# Patient Record
Sex: Female | Born: 1935 | Race: White | Hispanic: No | Marital: Married | Smoking: Never smoker
Health system: Southern US, Community
[De-identification: ages and names within clinical notes are randomized; demographics above are authoritative.]

## PROBLEM LIST (undated history)

## (undated) DIAGNOSIS — I219 Acute myocardial infarction, unspecified: Secondary | ICD-10-CM

## (undated) DIAGNOSIS — E785 Hyperlipidemia, unspecified: Secondary | ICD-10-CM

## (undated) DIAGNOSIS — M1990 Unspecified osteoarthritis, unspecified site: Secondary | ICD-10-CM

## (undated) DIAGNOSIS — N200 Calculus of kidney: Secondary | ICD-10-CM

## (undated) DIAGNOSIS — M7120 Synovial cyst of popliteal space [Baker], unspecified knee: Secondary | ICD-10-CM

## (undated) DIAGNOSIS — K589 Irritable bowel syndrome without diarrhea: Secondary | ICD-10-CM

## (undated) DIAGNOSIS — Z7189 Other specified counseling: Secondary | ICD-10-CM

## (undated) DIAGNOSIS — N390 Urinary tract infection, site not specified: Secondary | ICD-10-CM

## (undated) DIAGNOSIS — T7840XA Allergy, unspecified, initial encounter: Secondary | ICD-10-CM

## (undated) DIAGNOSIS — Z0000 Encounter for general adult medical examination without abnormal findings: Secondary | ICD-10-CM

## (undated) DIAGNOSIS — Z955 Presence of coronary angioplasty implant and graft: Secondary | ICD-10-CM

## (undated) DIAGNOSIS — I2510 Atherosclerotic heart disease of native coronary artery without angina pectoris: Secondary | ICD-10-CM

## (undated) DIAGNOSIS — R319 Hematuria, unspecified: Secondary | ICD-10-CM

## (undated) DIAGNOSIS — M810 Age-related osteoporosis without current pathological fracture: Secondary | ICD-10-CM

## (undated) DIAGNOSIS — F0390 Unspecified dementia without behavioral disturbance: Secondary | ICD-10-CM

## (undated) DIAGNOSIS — Z9289 Personal history of other medical treatment: Secondary | ICD-10-CM

## (undated) DIAGNOSIS — F419 Anxiety disorder, unspecified: Secondary | ICD-10-CM

## (undated) DIAGNOSIS — I10 Essential (primary) hypertension: Secondary | ICD-10-CM

## (undated) DIAGNOSIS — R079 Chest pain, unspecified: Secondary | ICD-10-CM

## (undated) DIAGNOSIS — K5790 Diverticulosis of intestine, part unspecified, without perforation or abscess without bleeding: Secondary | ICD-10-CM

## (undated) DIAGNOSIS — F03A0 Unspecified dementia, mild, without behavioral disturbance, psychotic disturbance, mood disturbance, and anxiety: Secondary | ICD-10-CM

## (undated) DIAGNOSIS — M797 Fibromyalgia: Secondary | ICD-10-CM

## (undated) DIAGNOSIS — J45909 Unspecified asthma, uncomplicated: Secondary | ICD-10-CM

## (undated) HISTORY — DX: Encounter for general adult medical examination without abnormal findings: Z00.00

## (undated) HISTORY — DX: Chest pain, unspecified: R07.9

## (undated) HISTORY — PX: CORONARY ANGIOPLASTY WITH STENT PLACEMENT: SHX49

## (undated) HISTORY — DX: Other specified counseling: Z71.89

## (undated) HISTORY — DX: Unspecified dementia, mild, without behavioral disturbance, psychotic disturbance, mood disturbance, and anxiety: F03.A0

## (undated) HISTORY — PX: TOTAL ABDOMINAL HYSTERECTOMY W/ BILATERAL SALPINGOOPHORECTOMY: SHX83

## (undated) HISTORY — DX: Essential (primary) hypertension: I10

## (undated) HISTORY — DX: Hyperlipidemia, unspecified: E78.5

## (undated) HISTORY — PX: CHOLECYSTECTOMY: SHX55

## (undated) HISTORY — DX: Fibromyalgia: M79.7

## (undated) HISTORY — DX: Synovial cyst of popliteal space (Baker), unspecified knee: M71.20

## (undated) HISTORY — DX: Calculus of kidney: N20.0

## (undated) HISTORY — PX: TONSILLECTOMY: SUR1361

## (undated) HISTORY — DX: Irritable bowel syndrome, unspecified: K58.9

## (undated) HISTORY — PX: BREAST BIOPSY: SHX20

## (undated) HISTORY — DX: Personal history of other medical treatment: Z92.89

## (undated) HISTORY — DX: Presence of coronary angioplasty implant and graft: Z95.5

## (undated) HISTORY — PX: LITHOTRIPSY: SUR834

## (undated) HISTORY — DX: Atherosclerotic heart disease of native coronary artery without angina pectoris: I25.10

## (undated) HISTORY — DX: Age-related osteoporosis without current pathological fracture: M81.0

## (undated) HISTORY — DX: Unspecified dementia, unspecified severity, without behavioral disturbance, psychotic disturbance, mood disturbance, and anxiety: F03.90

## (undated) HISTORY — DX: Acute myocardial infarction, unspecified: I21.9

## (undated) HISTORY — DX: Diverticulosis of intestine, part unspecified, without perforation or abscess without bleeding: K57.90

## (undated) HISTORY — PX: CYSTOSCOPY: SUR368

## (undated) HISTORY — DX: Anxiety disorder, unspecified: F41.9

## (undated) HISTORY — DX: Hematuria, unspecified: R31.9

## (undated) HISTORY — DX: Unspecified dementia without behavioral disturbance: F03.90

## (undated) HISTORY — PX: APPENDECTOMY: SHX54

## (undated) HISTORY — DX: Unspecified asthma, uncomplicated: J45.909

## (undated) HISTORY — DX: Urinary tract infection, site not specified: N39.0

## (undated) HISTORY — PX: URETEROSCOPY: SHX842

## (undated) HISTORY — DX: Allergy, unspecified, initial encounter: T78.40XA

## (undated) HISTORY — DX: Unspecified osteoarthritis, unspecified site: M19.90

## (undated) HISTORY — PX: ANGIOPLASTY: SHX39

## (undated) HISTORY — PX: BREAST SURGERY: SHX581

---

## 2008-08-12 HISTORY — PX: COLONOSCOPY: SHX174

## 2012-05-09 HISTORY — PX: CARDIAC CATHETERIZATION: SHX172

## 2012-05-15 DIAGNOSIS — I2510 Atherosclerotic heart disease of native coronary artery without angina pectoris: Secondary | ICD-10-CM | POA: Insufficient documentation

## 2012-05-16 DIAGNOSIS — I10 Essential (primary) hypertension: Secondary | ICD-10-CM | POA: Insufficient documentation

## 2012-07-15 DIAGNOSIS — M8580 Other specified disorders of bone density and structure, unspecified site: Secondary | ICD-10-CM | POA: Insufficient documentation

## 2012-09-23 DIAGNOSIS — N200 Calculus of kidney: Secondary | ICD-10-CM | POA: Insufficient documentation

## 2012-09-23 DIAGNOSIS — H47019 Ischemic optic neuropathy, unspecified eye: Secondary | ICD-10-CM | POA: Insufficient documentation

## 2013-07-28 DIAGNOSIS — I2510 Atherosclerotic heart disease of native coronary artery without angina pectoris: Secondary | ICD-10-CM

## 2013-07-28 DIAGNOSIS — R079 Chest pain, unspecified: Secondary | ICD-10-CM

## 2013-07-28 HISTORY — DX: Atherosclerotic heart disease of native coronary artery without angina pectoris: I25.10

## 2013-07-28 HISTORY — DX: Chest pain, unspecified: R07.9

## 2013-08-06 DIAGNOSIS — R109 Unspecified abdominal pain: Secondary | ICD-10-CM | POA: Insufficient documentation

## 2013-09-29 DIAGNOSIS — Z0000 Encounter for general adult medical examination without abnormal findings: Secondary | ICD-10-CM

## 2013-09-29 HISTORY — DX: Encounter for general adult medical examination without abnormal findings: Z00.00

## 2013-12-04 DIAGNOSIS — R609 Edema, unspecified: Secondary | ICD-10-CM | POA: Insufficient documentation

## 2013-12-04 DIAGNOSIS — F4320 Adjustment disorder, unspecified: Secondary | ICD-10-CM | POA: Insufficient documentation

## 2014-02-04 DIAGNOSIS — R413 Other amnesia: Secondary | ICD-10-CM | POA: Insufficient documentation

## 2014-06-05 DIAGNOSIS — F419 Anxiety disorder, unspecified: Secondary | ICD-10-CM | POA: Insufficient documentation

## 2014-10-07 DIAGNOSIS — F3341 Major depressive disorder, recurrent, in partial remission: Secondary | ICD-10-CM | POA: Insufficient documentation

## 2014-10-07 DIAGNOSIS — F3340 Major depressive disorder, recurrent, in remission, unspecified: Secondary | ICD-10-CM | POA: Insufficient documentation

## 2015-01-21 DIAGNOSIS — G301 Alzheimer's disease with late onset: Secondary | ICD-10-CM

## 2015-01-21 DIAGNOSIS — F0280 Dementia in other diseases classified elsewhere without behavioral disturbance: Secondary | ICD-10-CM | POA: Insufficient documentation

## 2015-02-04 ENCOUNTER — Emergency Department: Payer: Medicare Other

## 2015-02-04 ENCOUNTER — Emergency Department
Admission: EM | Admit: 2015-02-04 | Discharge: 2015-02-04 | Disposition: A | Discharge status: Home / Self Care | Payer: Medicare Other | Attending: Emergency Medicine | Admitting: Emergency Medicine

## 2015-02-04 ENCOUNTER — Encounter: Payer: Self-pay | Admitting: Emergency Medicine

## 2015-02-04 DIAGNOSIS — R531 Weakness: Secondary | ICD-10-CM | POA: Diagnosis not present

## 2015-02-04 DIAGNOSIS — R4182 Altered mental status, unspecified: Secondary | ICD-10-CM | POA: Diagnosis present

## 2015-02-04 DIAGNOSIS — I10 Essential (primary) hypertension: Secondary | ICD-10-CM | POA: Insufficient documentation

## 2015-02-04 DIAGNOSIS — Z79899 Other long term (current) drug therapy: Secondary | ICD-10-CM | POA: Diagnosis not present

## 2015-02-04 DIAGNOSIS — R410 Disorientation, unspecified: Secondary | ICD-10-CM

## 2015-02-04 DIAGNOSIS — F0390 Unspecified dementia without behavioral disturbance: Secondary | ICD-10-CM | POA: Insufficient documentation

## 2015-02-04 HISTORY — DX: Atherosclerotic heart disease of native coronary artery without angina pectoris: I25.10

## 2015-02-04 HISTORY — DX: Calculus of kidney: N20.0

## 2015-02-04 LAB — CBC WITH DIFFERENTIAL/PLATELET
Basophils Absolute: 0 10*3/uL (ref 0–0.1)
Basophils Relative: 1 %
Eosinophils Absolute: 0.2 10*3/uL (ref 0–0.7)
Eosinophils Relative: 4 %
HEMATOCRIT: 38.9 % (ref 35.0–47.0)
HEMOGLOBIN: 12.8 g/dL (ref 12.0–16.0)
Lymphocytes Relative: 26 %
Lymphs Abs: 1.4 10*3/uL (ref 1.0–3.6)
MCH: 29.1 pg (ref 26.0–34.0)
MCHC: 32.8 g/dL (ref 32.0–36.0)
MCV: 88.7 fL (ref 80.0–100.0)
MONOS PCT: 10 %
Monocytes Absolute: 0.5 10*3/uL (ref 0.2–0.9)
Neutro Abs: 3.2 10*3/uL (ref 1.4–6.5)
Neutrophils Relative %: 59 %
Platelets: 155 10*3/uL (ref 150–440)
RBC: 4.39 MIL/uL (ref 3.80–5.20)
RDW: 14.2 % (ref 11.5–14.5)
WBC: 5.4 10*3/uL (ref 3.6–11.0)

## 2015-02-04 LAB — COMPREHENSIVE METABOLIC PANEL
ALBUMIN: 3.6 g/dL (ref 3.5–5.0)
ALK PHOS: 88 U/L (ref 38–126)
ALT: 23 U/L (ref 14–54)
ANION GAP: 8 (ref 5–15)
AST: 31 U/L (ref 15–41)
BILIRUBIN TOTAL: 0.5 mg/dL (ref 0.3–1.2)
BUN: 18 mg/dL (ref 6–20)
CO2: 29 mmol/L (ref 22–32)
Calcium: 8.7 mg/dL — ABNORMAL LOW (ref 8.9–10.3)
Chloride: 104 mmol/L (ref 101–111)
Creatinine, Ser: 0.79 mg/dL (ref 0.44–1.00)
GFR calc Af Amer: >60 mL/min (ref >60)
GFR calc non Af Amer: >60 mL/min (ref >60)
Glucose, Bld: 98 mg/dL (ref 65–99)
Potassium: 4.6 mmol/L (ref 3.5–5.1)
SODIUM: 141 mmol/L (ref 135–145)
TOTAL PROTEIN: 6.6 g/dL (ref 6.5–8.1)

## 2015-02-04 LAB — URINALYSIS COMPLETE WITH MICROSCOPIC (ARMC ONLY)
Bacteria, UA: NONE SEEN
Bilirubin Urine: NEGATIVE
Glucose, UA: NEGATIVE mg/dL
Hgb urine dipstick: NEGATIVE
Ketones, ur: NEGATIVE mg/dL
NITRITE: NEGATIVE
PH: 5 (ref 5.0–8.0)
Protein, ur: NEGATIVE mg/dL
Specific Gravity, Urine: 1.011 (ref 1.005–1.030)

## 2015-02-04 LAB — TROPONIN I: Troponin I: <0.03 ng/mL (ref <0.031)

## 2015-02-04 NOTE — ED Provider Notes (Signed)
CSN: [id number]     Arrival date & time 02/04/15  [id number] History   First MD Initiated Contact with Patient 02/04/15 [phone]     Chief Complaint  Patient presents with  . Weakness  . Altered Mental Status     (Consider location/radiation/quality/duration/timing/severity/associated sxs/prior Treatment) The history is provided by the patient and the spouse. The history is limited by the condition of the patient.  Sara Stout is a 81 y.o. female through dementia, hypertension, hyperlipidemia here presenting with altered mental status. She woke up about 5:30 AM and was more tired than usual. She was making coffee this morning and then went back to sleep. Also was confused as per the husband. She was a little disoriented and didn't know where she was. She doesn't remember what happened and feels fine now. Denies any history of stroke and was noted to have low-grade temp 99 by EMS but denies any fevers at home. Denies any cough or abdominal pain or urinary symptoms.   Level V caveat- dementia   Past Medical History  Diagnosis Date  . Coronary artery disease   . Kidney stones    Past Surgical History  Procedure Laterality Date  . Angioplasty    . Cholecystectomy    . Abdominal hysterectomy     No family history on file. History  Substance Use Topics  . Smoking status: Never Smoker   . Smokeless tobacco: Not on file  . Alcohol Use: No   OB History    No data available     Review of Systems  Neurological: Positive for weakness.  All other systems reviewed and are negative.     Allergies  Review of patient's allergies indicates not on file.  Home Medications   Prior to Admission medications   Medication Sig Start Date End Date Taking? Authorizing Provider  atorvastatin (LIPITOR) 80 MG tablet Take 80 mg by mouth at bedtime.   Yes Historical Provider, MD  donepezil (ARICEPT) 10 MG tablet Take 10 mg by mouth at bedtime.   Yes Historical Provider, MD  escitalopram (LEXAPRO) 10 MG  tablet Take 10 mg by mouth at bedtime.   Yes Historical Provider, MD  lisinopril (PRINIVIL,ZESTRIL) 2.5 MG tablet Take 2.5 mg by mouth at bedtime.   Yes Historical Provider, MD  memantine (NAMENDA XR) 28 MG CP24 24 hr capsule Take 28 mg by mouth at bedtime.   Yes Historical Provider, MD   BP 170/66 mmHg  Pulse 54  Temp(Src) 98.8 F (37.1 C) (Oral)  Resp 14  Ht 5\' 4"  (1.626 m)  Wt 158 lb (71.668 kg)  BMI 27.11 kg/m2  SpO2 98% Physical Exam  Constitutional:  Chronically ill, NAD   HENT:  Head: Normocephalic.  Mouth/Throat: Oropharynx is clear and moist.  Eyes: Conjunctivae are normal. Pupils are equal, round, and reactive to light.  Neck: Normal range of motion. Neck supple.  Cardiovascular: Normal rate, regular rhythm and normal heart sounds.   Pulmonary/Chest: Effort normal and breath sounds normal. No respiratory distress. She has no wheezes. She has no rales.  Abdominal: Soft. Bowel sounds are normal. She exhibits no distension. There is no tenderness. There is no rebound and no guarding.  Musculoskeletal: Normal range of motion. She exhibits no edema or tenderness.  Neurological:  Alert, A &O x 2 (baseline), CN 2-12 intact. Nl strength throughout. Nl finger to nose. Nl sensation.   Skin: Skin is warm and dry.  Psychiatric: She has a normal mood and affect. Her behavior is normal. Judgment  and thought content normal.  Nursing note and vitals reviewed.   ED Course  Procedures (including critical care time) Labs Review Labs Reviewed  COMPREHENSIVE METABOLIC PANEL - Abnormal; Notable for the following:    Calcium 8.7 (*)    All other components within normal limits  URINALYSIS COMPLETEWITH MICROSCOPIC ([facility] ONLY) - Abnormal; Notable for the following:    Color, Urine STRAW (*)    APPearance CLEAR (*)    Leukocytes, UA 1+ (*)    Squamous Epithelial / LPF 0-5 (*)    All other components within normal limits  CBC WITH DIFFERENTIAL/PLATELET  TROPONIN I    Imaging  Review Dg Chest 2 View  02/04/2015   CLINICAL DATA:  Confusion/altered mental status  EXAM: CHEST  2 VIEW  COMPARISON:  None.  FINDINGS: There is no edema or consolidation. Heart size and pulmonary vascularity are normal. No adenopathy. There is atherosclerotic change in the aorta. No bone lesions.  IMPRESSION: No edema or consolidation.   Electronically Signed   By: Bretta Bang III M.D.   On: 02/04/2015 09:08   Ct Head Wo Contrast  02/04/2015   CLINICAL DATA:  Weakness. Altered mental status. The patient awoke and was normal today. After lying down again for a short period, she was confused.  EXAM: CT HEAD WITHOUT CONTRAST  TECHNIQUE: Contiguous axial images were obtained from the base of the skull through the vertex without intravenous contrast.  COMPARISON:  None.  FINDINGS: Mild generalized atrophy and white matter hypoattenuation is likely within normal limits for age. No acute infarct, hemorrhage, or mass lesion is present. The ventricles are of normal size. No significant extraaxial fluid collection is present.  Vascular calcifications are present at the dural margin of the vertebral arteries and within the cavernous internal carotid arteries bilaterally. The paranasal sinuses and mastoid air cells are clear. Calvarium is intact. The visualized globes and orbits are within normal limits.  IMPRESSION: Negative CT of the head.   Electronically Signed   By: Marin Roberts M.D.   On: 02/04/2015 09:12     EKG Interpretation None     ED ECG REPORT   Date: 02/04/2015  EKG Time: 10:57 AM  Rate: 55  Rhythm: sinus bradycardia,  sinus bradycardia  Axis: normal  Intervals:none  ST&T Change: TWI III, aVF  Narrative Interpretation:              MDM   Final diagnoses:  Confusion    Sara Stout is a 81 y.o. female here with confusion. Consider dementia vs electrolyte abnormalities vs infection. Will get CT head, labs, UA, CXR.   10:57 AM Labs and UA and CT head and CXR  unremarkable. Able to ambulate. BP dec to 130/50 with no intervention. Back to baseline. Likely dementia. Will dc home.    Richardean Canal, MD 02/04/15 1058

## 2015-02-04 NOTE — ED Notes (Signed)
Pts husband states she got up this morning, made a cup of coffee, then came in the room, laid down, then woke up a few minutes stating she was confused and didn't recognize her surroundings, speech clear per husband, but states she was very weak, unable to lift her arms. Upon arrival to ED, pt appears in no distress, however, falls asleep/weak in between questions. Pt able to answer all questions appropriately, NIH negative. Low grade temp of 99, BG 96 per ems. Pt denies any complaints.

## 2015-02-04 NOTE — ED Notes (Signed)
Pt sitting up in bed, alert and oriented, denies any complaints.

## 2015-02-04 NOTE — ED Notes (Signed)
Patient transported to X-ray 

## 2015-02-04 NOTE — ED Notes (Signed)
PT AMBULATED TO BATHROOM AND BACK TO BED. PT HAD A STEADY GAIT.

## 2015-02-04 NOTE — ED Notes (Signed)
Patient transported to CT 

## 2015-02-04 NOTE — ED Notes (Signed)
Pt husband here to visit. Advised to call [phone] for transportation when he is ready.

## 2015-02-04 NOTE — ED Notes (Signed)
Taken to lobby via wheelchair, husband with pt.

## 2015-02-04 NOTE — Discharge Instructions (Signed)
Continue taking your current meds.   See your doctor.   Return to ER if you are more confused, lethargic, not behaving normally.

## 2015-02-04 NOTE — ED Notes (Signed)
Family at bedside. 

## 2015-05-12 ENCOUNTER — Encounter: Payer: Self-pay | Admitting: *Deleted

## 2015-05-12 DIAGNOSIS — M797 Fibromyalgia: Secondary | ICD-10-CM | POA: Insufficient documentation

## 2015-05-12 DIAGNOSIS — Z955 Presence of coronary angioplasty implant and graft: Secondary | ICD-10-CM | POA: Insufficient documentation

## 2015-05-12 DIAGNOSIS — E785 Hyperlipidemia, unspecified: Secondary | ICD-10-CM | POA: Insufficient documentation

## 2015-05-12 DIAGNOSIS — Z9889 Other specified postprocedural states: Secondary | ICD-10-CM | POA: Insufficient documentation

## 2015-05-12 DIAGNOSIS — K589 Irritable bowel syndrome without diarrhea: Secondary | ICD-10-CM | POA: Insufficient documentation

## 2015-06-02 ENCOUNTER — Encounter: Payer: Self-pay | Admitting: Urology

## 2015-06-02 ENCOUNTER — Ambulatory Visit (INDEPENDENT_AMBULATORY_CARE_PROVIDER_SITE_OTHER): Payer: Medicare Other | Admitting: Urology

## 2015-06-02 VITALS — BP 116/74 | HR 85 | Ht 63.0 in | Wt 154.2 lb

## 2015-06-02 DIAGNOSIS — I2510 Atherosclerotic heart disease of native coronary artery without angina pectoris: Secondary | ICD-10-CM | POA: Insufficient documentation

## 2015-06-02 DIAGNOSIS — N200 Calculus of kidney: Secondary | ICD-10-CM | POA: Diagnosis not present

## 2015-06-02 DIAGNOSIS — R300 Dysuria: Secondary | ICD-10-CM

## 2015-06-02 DIAGNOSIS — M797 Fibromyalgia: Secondary | ICD-10-CM | POA: Insufficient documentation

## 2015-06-02 LAB — URINALYSIS, COMPLETE
Bilirubin, UA: NEGATIVE
Glucose, UA: NEGATIVE
Nitrite, UA: NEGATIVE
PROTEIN UA: NEGATIVE
RBC, UA: NEGATIVE
Specific Gravity, UA: 1.025 (ref 1.005–1.030)
Urobilinogen, Ur: 0.2 mg/dL (ref 0.2–1.0)
pH, UA: 5 (ref 5.0–7.5)

## 2015-06-02 LAB — MICROSCOPIC EXAMINATION
Bacteria, UA: NONE SEEN
RBC MICROSCOPIC, UA: NONE SEEN /HPF (ref 0–?)

## 2015-06-02 LAB — BLADDER SCAN AMB NON-IMAGING: Scan Result: 12

## 2015-06-02 MED ORDER — PHENAZOPYRIDINE HCL 95 MG PO TABS
95.0000 mg | ORAL_TABLET | Freq: Three times a day (TID) | ORAL | Status: DC
Start: 2015-06-02 — End: 2015-07-23

## 2015-06-02 NOTE — Progress Notes (Signed)
06/02/2015 2:35 PM   Sara Stout April 15, 1931 [id number]  Referring Elke Holtry: [name], MD [address] [place], [place] [address]  Chief Complaint  Patient presents with  . Dysuria    with history of nephrolithasis     HPI: The patient is a 81 year old female with a history of renal stones. She last had a renal stone 5 years ago. At this time she is asymptomatic from renal stones. She presents today after moving to the area and wanted to establish care with the urologist again she is not symptom at this time from stones. She also notes a intermittent vague dysuria for the last few months. She had a urine culture at the emergency clinic that was negative. She was on Cipro for 3 days until the culture came back negative. She is very just comfort now. Her urinalysis shows 0-5 white blood cells 0 10 epithelial cells. She denies any cloudy urine. Denies frequency, urgency, intermittency, hesitancy, weak stream and vaginal bleeding.  PMH: Past Medical History  Diagnosis Date  . Coronary artery disease   . Kidney stones   . Diverticulosis   . UTI (lower urinary tract infection)   . Hematuria   . IBS (irritable bowel syndrome)   . Fibromyalgia     Surgical History: Past Surgical History  Procedure Laterality Date  . Angioplasty    . Cholecystectomy    . Abdominal hysterectomy    . Tonsillectomy    . Appendectomy    . Total abdominal hysterectomy w/ bilateral salpingoophorectomy    . Breast biopsy      benign nodule  . Cystoscopy      Home Medications:    Medication List       This list is accurate as of: 06/02/15  2:35 PM.  Always use your most recent med list.               aspirin 81 MG tablet  Take 81 mg by mouth.     atorvastatin 80 MG tablet  Commonly known as:  LIPITOR  Take 80 mg by mouth at bedtime.     ciprofloxacin 500 MG tablet  Commonly known as:  CIPRO     donepezil 10 MG tablet  Commonly known as:  ARICEPT  Take 10 mg by mouth  at bedtime.     escitalopram 10 MG tablet  Commonly known as:  LEXAPRO  Take 10 mg by mouth at bedtime.     hydrocortisone cream 1 %  Apply topically.     lisinopril 2.5 MG tablet  Commonly known as:  PRINIVIL,ZESTRIL  Take 2.5 mg by mouth at bedtime.     loratadine 10 MG tablet  Commonly known as:  CLARITIN  Take 10 mg by mouth.     meloxicam 15 MG tablet  Commonly known as:  MOBIC  Take by mouth.     memantine 28 MG Cp24 24 hr capsule  Commonly known as:  NAMENDA XR  Take 28 mg by mouth at bedtime.     NITROSTAT 0.4 MG SL tablet  Generic drug:  nitroGLYCERIN  Place 1 tablet under the tongue.        Allergies:  Allergies  Allergen Reactions  . Epinephrine Other (See Comments)    Rapid heart beat.  . Dtap-Hepatitis B Recomb-Ipv Other (See Comments)  . Levofloxacin     Edema in knee  . Metronidazole     Nausea and edema in knee  . Tetanus Toxoids  Arm swelling    Family History: Family History  Problem Relation Age of Onset  . Prostate cancer Neg Hx   . Bladder Cancer Neg Hx   . Kidney cancer Neg Hx     Social History:  reports that she has never smoked. She does not have any smokeless tobacco history on file. She reports that she does not drink alcohol or use illicit drugs.  ROS: UROLOGY Frequent Urination?: No Hard to postpone urination?: No Burning/pain with urination?: No Get up at night to urinate?: No Leakage of urine?: No Urine stream starts and stops?: No Trouble starting stream?: No Do you have to strain to urinate?: No Blood in urine?: No Urinary tract infection?: No Sexually transmitted disease?: No Injury to kidneys or bladder?: No Painful intercourse?: No Weak stream?: No Currently pregnant?: No Vaginal bleeding?: No Last menstrual period?: n  Gastrointestinal Nausea?: No Vomiting?: No Indigestion/heartburn?: No Diarrhea?: No Constipation?: No  Constitutional Fever: No Night sweats?: No Weight loss?: No Fatigue?:  No  Skin Skin rash/lesions?: No Itching?: No  Eyes Blurred vision?: No Double vision?: No  Ears/Nose/Throat Sore throat?: No Sinus problems?: Yes  Hematologic/Lymphatic Swollen glands?: No Easy bruising?: No  Cardiovascular Leg swelling?: Yes Chest pain?: No  Respiratory Cough?: No Shortness of breath?: No  Endocrine Excessive thirst?: No  Musculoskeletal Back pain?: No Joint pain?: Yes  Neurological Headaches?: No Dizziness?: Yes  Psychologic Depression?: No Anxiety?: Yes  Physical Exam: Ht  (1.6 m)  Wt 154 lb 3.2 oz (69.945 kg)  BMI 27.32 kg/m2  Constitutional:  Alert and oriented, No acute distress. HEENT: [place] AT, moist mucus membranes.  Trachea midline, no masses. Cardiovascular: No clubbing, cyanosis, or edema. Respiratory: Normal respiratory effort, no increased work of breathing. GI: Abdomen is soft, nontender, nondistended, no abdominal masses GU: No CVA tenderness.  Skin: No rashes, bruises or suspicious lesions. Lymph: No cervical or inguinal adenopathy. Neurologic: Grossly intact, no focal deficits, moving all 4 extremities. Psychiatric: Normal mood and affect.  Laboratory Data: Lab Results  Component Value Date   WBC 5.4 02/04/2015   HGB 12.8 02/04/2015   HCT 38.9 02/04/2015   MCV 88.7 02/04/2015   PLT 155 02/04/2015    Lab Results  Component Value Date   CREATININE 0.79 02/04/2015    No results found for: PSA  No results found for: TESTOSTERONE  No results found for: HGBA1C  Urinalysis    Component Value Date/Time   COLORURINE STRAW* 02/04/2015 0926   APPEARANCEUR CLEAR* 02/04/2015 0926   LABSPEC 1.011 02/04/2015 0926   PHURINE 5.0 02/04/2015 0926   GLUCOSEU NEGATIVE 02/04/2015 0926   HGBUR NEGATIVE 02/04/2015 0926   BILIRUBINUR NEGATIVE 02/04/2015 0926   KETONESUR NEGATIVE 02/04/2015 0926   PROTEINUR NEGATIVE 02/04/2015 0926   NITRITE NEGATIVE 02/04/2015 0926   LEUKOCYTESUR 1+* 02/04/2015 0926    *  Assessment & Plan:    1. History of Calculus of kidney -No evidence of stone. Follow-up for renal stones when necessary  2. Dysuria -Urine culture -We'll give the patient 2 days of Pyridium for dysuria. We wil only treat her dysuria with antibiotics if her culture comes back positive -The patient will follow-up in one month for systems pertinent persist   No Follow-up on file.  Hildred Laser, MD  [facility] [address], [address] [place], [place] [address] [phone]

## 2015-06-04 LAB — CULTURE, URINE COMPREHENSIVE

## 2015-07-07 ENCOUNTER — Ambulatory Visit: Payer: Medicare Other

## 2015-07-08 ENCOUNTER — Ambulatory Visit: Payer: Medicare Other | Admitting: Urology

## 2015-07-08 ENCOUNTER — Encounter: Payer: Self-pay | Admitting: Urology

## 2015-07-23 ENCOUNTER — Encounter: Payer: Self-pay | Admitting: Urology

## 2015-07-23 ENCOUNTER — Ambulatory Visit (INDEPENDENT_AMBULATORY_CARE_PROVIDER_SITE_OTHER): Payer: Medicare Other | Admitting: Urology

## 2015-07-23 VITALS — BP 122/76 | HR 83 | Ht 63.0 in | Wt 156.3 lb

## 2015-07-23 DIAGNOSIS — N200 Calculus of kidney: Secondary | ICD-10-CM | POA: Diagnosis not present

## 2015-07-23 DIAGNOSIS — R300 Dysuria: Secondary | ICD-10-CM | POA: Diagnosis not present

## 2015-07-23 LAB — URINALYSIS, COMPLETE
BILIRUBIN UA: NEGATIVE
Glucose, UA: NEGATIVE
Ketones, UA: NEGATIVE
NITRITE UA: NEGATIVE
PH UA: 6 (ref 5.0–7.5)
PROTEIN UA: NEGATIVE
RBC, UA: NEGATIVE
Specific Gravity, UA: 1.015 (ref 1.005–1.030)
UUROB: 0.2 mg/dL (ref 0.2–1.0)

## 2015-07-23 LAB — MICROSCOPIC EXAMINATION: RBC MICROSCOPIC, UA: NONE SEEN /HPF (ref 0–?)

## 2015-07-23 NOTE — Progress Notes (Signed)
07/23/2015 3:50 PM   Sara Stout 1931/08/17 [id number]  Referring provider: [name], MD [address] [facility] [place] [facility] [place], [place] [address]  Chief Complaint  Patient presents with  . Nephrolithiasis    HPI: The patient is a 81 year old female with a history of renal stones. She last had a renal stone 5 years ago. At this time she is asymptomatic from renal stones. She presents today after moving to the area and wanted to establish care with the urologist again she is not symptom at this time from stones. She also notes a intermittent vague dysuria for the last few months. She had a urine culture at the emergency clinic that was negative. She was on Cipro for 3 days until the culture came back negative. She has some comfort now. Her urinalysis shows 0-5 white blood cells 0 10 epithelial cells. She denies any cloudy urine. Denies frequency, urgency, intermittency, hesitancy, weak stream and vaginal bleeding.  Interval History: The patient's urine culture from her last visit was negative.  Her dysuria that her last a few months has resolved at this time without antibiotic therapy. She has no flank pain or other symptoms of kidney stones at this time. She is otherwise doing well.   PMH: Past Medical History  Diagnosis Date  . Coronary artery disease   . Kidney stones   . Diverticulosis   . UTI (lower urinary tract infection)   . Hematuria   . IBS (irritable bowel syndrome)   . Fibromyalgia     Surgical History: Past Surgical History  Procedure Laterality Date  . Angioplasty    . Cholecystectomy    . Abdominal hysterectomy    . Tonsillectomy    . Appendectomy    . Total abdominal hysterectomy w/ bilateral salpingoophorectomy    . Breast biopsy      benign nodule  . Cystoscopy      Home Medications:    Medication List       This list is accurate as of: 07/23/15  3:50 PM.  Always use your most recent med list.               aspirin  81 MG tablet  Take 81 mg by mouth.     atorvastatin 80 MG tablet  Commonly known as:  LIPITOR  Take 80 mg by mouth at bedtime.     escitalopram 10 MG tablet  Commonly known as:  LEXAPRO  Take 10 mg by mouth at bedtime.     hydrocortisone cream 1 %  Apply topically.     meloxicam 15 MG tablet  Commonly known as:  MOBIC  Take by mouth.     memantine 28 MG Cp24 24 hr capsule  Commonly known as:  NAMENDA XR  Take 28 mg by mouth at bedtime.     NITROSTAT 0.4 MG SL tablet  Generic drug:  nitroGLYCERIN  Place 1 tablet under the tongue.        Allergies:  Allergies  Allergen Reactions  . Epinephrine Other (See Comments)    Rapid heart beat.  . Dtap-Hepatitis B Recomb-Ipv Other (See Comments)  . Levofloxacin     Edema in knee  . Metronidazole     Nausea and edema in knee  . Tetanus Toxoids     Arm swelling    Family History: Family History  Problem Relation Age of Onset  . Prostate cancer Neg Hx   . Bladder Cancer Neg Hx   .  Kidney cancer Neg Hx     Social History:  reports that she has never smoked. She does not have any smokeless tobacco history on file. She reports that she does not drink alcohol or use illicit drugs.  ROS: UROLOGY Frequent Urination?: No Hard to postpone urination?: No Burning/pain with urination?: No Get up at night to urinate?: No Leakage of urine?: Yes Urine stream starts and stops?: No Trouble starting stream?: No Do you have to strain to urinate?: No Blood in urine?: No Urinary tract infection?: Yes Sexually transmitted disease?: No Injury to kidneys or bladder?: No Painful intercourse?: No Weak stream?: No Currently pregnant?: No Vaginal bleeding?: No Last menstrual period?: n  Gastrointestinal Nausea?: No Vomiting?: No Indigestion/heartburn?: No Diarrhea?: No Constipation?: No  Constitutional Fever: No Night sweats?: No Weight loss?: No Fatigue?: No  Skin Skin rash/lesions?: No Itching?: No  Eyes Blurred  vision?: No Double vision?: No  Ears/Nose/Throat Sore throat?: No Sinus problems?: Yes  Hematologic/Lymphatic Swollen glands?: No Easy bruising?: No  Cardiovascular Leg swelling?: Yes Chest pain?: No  Respiratory Cough?: No Shortness of breath?: No  Endocrine Excessive thirst?: No  Musculoskeletal Back pain?: No Joint pain?: Yes  Neurological Headaches?: No Dizziness?: Yes  Psychologic Depression?: No Anxiety?: No  Physical Exam: BP 122/76 mmHg  Pulse 83  Ht 5\' 3"  (1.6 m)  Wt 156 lb 4.8 oz (70.897 kg)  BMI 27.69 kg/m2  Constitutional:  Alert and oriented, No acute distress. HEENT: [place] AT, moist mucus membranes.  Trachea midline, no masses. Cardiovascular: No clubbing, cyanosis, or edema. Respiratory: Normal respiratory effort, no increased work of breathing. GI: Abdomen is soft, nontender, nondistended, no abdominal masses GU: No CVA tenderness.  Skin: No rashes, bruises or suspicious lesions. Lymph: No cervical or inguinal adenopathy. Neurologic: Grossly intact, no focal deficits, moving all 4 extremities. Psychiatric: Normal mood and affect.  Laboratory Data: Lab Results  Component Value Date   WBC 5.4 02/04/2015   HGB 12.8 02/04/2015   HCT 38.9 02/04/2015   MCV 88.7 02/04/2015   PLT 155 02/04/2015    Lab Results  Component Value Date   CREATININE 0.79 02/04/2015    No results found for: PSA  No results found for: TESTOSTERONE  No results found for: HGBA1C  Urinalysis    Component Value Date/Time   COLORURINE STRAW* 02/04/2015 0926   APPEARANCEUR CLEAR* 02/04/2015 0926   LABSPEC 1.011 02/04/2015 0926   PHURINE 5.0 02/04/2015 0926   GLUCOSEU Negative 06/02/2015 1421   HGBUR NEGATIVE 02/04/2015 0926   BILIRUBINUR Negative 06/02/2015 1421   BILIRUBINUR NEGATIVE 02/04/2015 0926   KETONESUR NEGATIVE 02/04/2015 0926   PROTEINUR NEGATIVE 02/04/2015 0926   NITRITE Negative 06/02/2015 1421   NITRITE NEGATIVE 02/04/2015 0926   LEUKOCYTESUR  Trace* 06/02/2015 1421   LEUKOCYTESUR 1+* 02/04/2015 0926    Assessment & Plan:  1. Kidney stones No evidence of disease  2. Dysuria This has resolved without antibiotic therapy. No further workup needed. The patient was instructed to call the office if her symptoms of dysuria return or if she develops a urinary tract infection.   Return if symptoms worsen or fail to improve.  Hildred LaserBrian James Candler Ginsberg, MD  [facility] [address], [address] [place], [place] [address] [phone]

## 2015-08-25 ENCOUNTER — Ambulatory Visit: Payer: Medicare Other | Attending: Neurology

## 2015-08-25 VITALS — BP 109/57 | HR 90

## 2015-08-25 DIAGNOSIS — R2689 Other abnormalities of gait and mobility: Secondary | ICD-10-CM

## 2015-08-25 DIAGNOSIS — M25561 Pain in right knee: Secondary | ICD-10-CM | POA: Diagnosis present

## 2015-08-25 DIAGNOSIS — R531 Weakness: Secondary | ICD-10-CM | POA: Diagnosis present

## 2015-08-25 NOTE — Therapy (Signed)
[place] [facility] [address] [place], [place], [address] Phone: [phone]   Fax:  [phone]  Physical Therapy Evaluation  Patient Details  Name: Sara PlumHelen Stout MRN: [id number] Date of Birth: [birth year] Referring Provider: Cristopher PeruHemang Shah, MD  Encounter Date: 08/25/2015      PT End of Session - 08/25/15 1634    Visit Number 1   Number of Visits 9   Authorization Type 1   Authorization Time Period of 10   PT Start Time 1634   PT Stop Time 1737   PT Time Calculation (min) 63 min   Activity Tolerance Patient tolerated treatment well   Behavior During Therapy [facility] for tasks assessed/performed      Past Medical History  Diagnosis Date  . Coronary artery disease   . Diverticulosis   . UTI (lower urinary tract infection)   . Hematuria   . IBS (irritable bowel syndrome)   . Fibromyalgia   . Mild dementia   . Hypertension   . Osteoporosis   . Allergy   . Asthma   . Baker's cyst     R knee  . Fibromyalgia   . Coronary heart disease   . Anxiety   . Kidney stones     Past Surgical History  Procedure Laterality Date  . Angioplasty    . Cholecystectomy    . Abdominal hysterectomy    . Tonsillectomy    . Appendectomy    . Total abdominal hysterectomy w/ bilateral salpingoophorectomy    . Breast biopsy      benign nodule  . Cystoscopy    . Coronary angioplasty with stent placement      Filed Vitals:   08/25/15 1642 08/25/15 1702  BP: 108/55 109/57  Pulse: 77 90    Visit Diagnosis:  Imbalance - Plan: PT plan of care cert/re-cert  Weakness - Plan: PT plan of care cert/re-cert  Right knee pain - Plan: PT plan of care cert/re-cert      Subjective Assessment - 08/25/15 1642    Subjective R knee pain: 2/10 current. 5/10 at worst when pt moves too fast   Pertinent History Balance difficulties. Husband thinks her balance might be due to medications. Just got out of blood pressure medication. Pt usually  shaky in the mornings and pt cannot do too much because she might pass out. She has to sit for about 20 seconds before standing up. Has to sit down at times. Pt husband also states pt having memory problems and vertigo.  Pt husband also states pt having baker's cyst behind R knee.  Pt states that when she gets dizzy, the room does not really spin. No falls past 6 months. Pt husband however states pt loses balance and catches her. Balance problems started 2 years ago. Uses a SPC at times.    Patient Stated Goals Improve balance   Currently in Pain? Yes   Pain Score 2   2/10 current. 5/10 at worst when pt moves too fast   Pain Location --  R knee   Multiple Pain Sites No            [facility] - 08/25/15 1651    Assessment   Medical Diagnosis Imbalance   Referring Provider Cristopher PeruHemang Shah, MD   Onset Date/Surgical Date 07/30/15   Prior Therapy No known physical therapy for current condition   Precautions   Precaution Comments fall risk   Restrictions   Other Position/Activity Restrictions no  known restrictions   Balance Screen   Has the patient fallen in the past 6 months Yes  Medical screening form answer: fall past month   Has the patient had a decrease in activity level because of a fear of falling?  --  Not known but pt states fear of falling.    Is the patient reluctant to leave their home because of a fear of falling?  --  Not known but pt states fear of falling.    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, with less unsteadiness   Observation/Other Assessments   Observations (-) VBI   Lower Extremity Functional Scale  48/80   Posture/Postural Control   Posture Comments Bilaterally protracted shoulders and neck, bilateral foot pronation, R shoulder more lateral    AROM   Lumbar Flexion WFL   Lumbar Extension WFL   Lumbar - Right Side Bend WFL with slight low back discomfort   Lumbar - Left Side Bend WFL with slight  lumbar discomfort   Lumbar - Right Rotation WFL with low back dicomfort    Lumbar - Left Rotation WFL with R posterior knee discomfort   Strength   Overall Strength Comments Room spinning with transition of movement from supine to sit one time. When movement was repeated, symptoms no longer reproduced.    Right Hip Flexion 4/5   Right Hip Extension 4-/5   Right Hip External Rotation  4-/5   Right Hip ABduction 4/5   Left Hip Flexion 4/5   Left Hip Extension 4-/5   Left Hip External Rotation 4/5   Left Hip ABduction 4/5   Right Knee Flexion 4+/5   Right Knee Extension 5/5   Left Knee Flexion 4+/5   Left Knee Extension 5/5   Ambulation/Gait   Gait Comments Contralateral pelvic drop during stance phase bilaterally   Dynamic Gait Index   Level Surface Normal   Change in Gait Speed Normal   Gait with Horizontal Head Turns Normal   Gait with Vertical Head Turns Normal   Gait and Pivot Turn Normal   Step Over Obstacle Normal   Step Around Obstacles Mild Impairment   Steps Normal   Total Score 23   DGI comment: < or = 19 indicates risk for falls. Pt scored 23/24. Pt husband however, states that when patient stands after sitting for a while, pt loses balance.                            PT Education - 08/25/15 2033    Education provided Yes   Education Details plan of care   Person(s) Educated Patient   Methods Explanation   Comprehension Verbalized understanding             PT Long Term Goals - 08/25/15 2042    PT LONG TERM GOAL #1   Title Patient will improve her LEFS score by at least 9 points as a demonstration of improved function.    Baseline 48/80   Time 4   Period Weeks   Status New   PT LONG TERM GOAL #2   Title Patient will improve bilateral glute med and max strength by at least 1/2 MMT grade to promote ability to perform standing tasks.    Time 4   Period Weeks   Status New   PT LONG TERM GOAL #3   Title Patient will have a decrease in  R knee pain to 3/10 or less at worst to promote ability to perform standing tasks.    Baseline 5/10   Time 4   Period Weeks   Status New               Plan - 09-01-15 01[birth year]-32    Clinical Impression Statement Patient is an 81 year old female who came to phyisical therapy secondary to difficulty with balance. She also presents with bilateral hip weakness, altered posture, and R knee pain. Pt also scored very well with the Dynamic Gait Index suggesting decreased risk for falls when walking. Pt husband however mentions that patient loses balance after standing up from sitting a while. Patient will benefit from skilled physical therapy services to promote ability to stand and walk after prolonged sitting with less unsteadiness, as well as to improve bilateral LE strength, and to decrease R knee pain to promote ability to perform standing tasks.    Pt will benefit from skilled therapeutic intervention in order to improve on the following deficits Decreased strength;Pain;Decreased balance  Hx of fall per medical screening form   Rehab Potential Good   Clinical Impairments Affecting Rehab Potential age, mild dementia   PT Frequency 2x / week   PT Duration 4 weeks   PT Treatment/Interventions Therapeutic exercise;Manual techniques;Therapeutic activities;Iontophoresis /ml Dexamethasone;Patient/family education;Ultrasound;Balance training   PT Next Visit Plan Try Berg Balance Test. Hip strengthening.    Consulted and Agree with Plan of Care Patient;Family member/caregiver   Family Member Consulted husband          G-Codes - 2015-09-01 01-16-45    Functional Assessment Tool Used LEFS, patient interview, clinical presentation   Functional Limitation Mobility: Walking and moving around   Mobility: Walking and Moving Around Current Status [phone]) At least 40 percent but less than 60 percent impaired, limited or restricted   Mobility: Walking and Moving Around Goal Status [phone]) At least 20 percent  but less than 40 percent impaired, limited or restricted       Problem List Patient Active Problem List   Diagnosis Date Noted  . CAD in native artery 06/02/2015  . Fibromyalgia 06/02/2015  . Presence of stent in coronary artery 05/12/2015  . Adaptive colitis 05/12/2015  . HLD (hyperlipidemia) 05/12/2015  . History of biliary T-tube placement 05/12/2015  . Fibrositis 05/12/2015  . Alzheimer's dementia, late onset 01/21/2015  . Depression, major, recurrent, in partial remission (HCC) 10/07/2014  . Depression, major, in remission (HCC) 10/07/2014  . Anxiety disorder 06/05/2014  . Amnesia 02/04/2014  . Accumulation of fluid in tissues 12/04/2013  . Adaptation reaction 12/04/2013  . Left flank pain 08/06/2013  . Calculus of kidney 09/23/2012  . ION (ischemic optic neuropathy) 09/23/2012  . Osteopenia 07/15/2012  . BP (high blood pressure) 05/16/2012  . Atherosclerosis of coronary artery 05/15/2012   Thank you for your referral.   Loralyn Freshwater PT, DPT   Sep 01, 2015, 9:01 PM  [place] [facility] PHYSICAL AND SPORTS MEDICINE [address], [place], [address] Phone: [phone]   Fax:  [phone]  Name: Sara Stout MRN: [id number] Date of Birth: Jul 01, 1931

## 2015-08-30 ENCOUNTER — Ambulatory Visit: Payer: Medicare Other

## 2015-08-30 DIAGNOSIS — R2689 Other abnormalities of gait and mobility: Secondary | ICD-10-CM

## 2015-08-30 DIAGNOSIS — R531 Weakness: Secondary | ICD-10-CM

## 2015-08-30 DIAGNOSIS — M25561 Pain in right knee: Secondary | ICD-10-CM

## 2015-08-30 NOTE — Therapy (Signed)
[place] [facility] [address] [place], [place], [address] Phone: [phone]   Fax:  [phone]  Physical Therapy Treatment  Patient Details  Name: Sara PlumHelen Stout MRN: [id number] Date of Birth: 1931-08-01 Referring Provider: Cristopher PeruHemang Shah, MD  Encounter Date: 08/30/2015      PT End of Session - 08/30/15 1448    Visit Number 2   Number of Visits 9   Authorization Type 2   Authorization Time Period of 10   PT Start Time 1448  Pt arived late   PT Stop Time 1534   PT Time Calculation (min) 46 min   Activity Tolerance Patient tolerated treatment well   Behavior During Therapy [facility] for tasks assessed/performed      Past Medical History  Diagnosis Date  . Coronary artery disease   . Diverticulosis   . UTI (lower urinary tract infection)   . Hematuria   . IBS (irritable bowel syndrome)   . Fibromyalgia   . Mild dementia   . Hypertension   . Osteoporosis   . Allergy   . Asthma   . Baker's cyst     R knee  . Fibromyalgia   . Coronary heart disease   . Anxiety   . Kidney stones     Past Surgical History  Procedure Laterality Date  . Angioplasty    . Cholecystectomy    . Abdominal hysterectomy    . Tonsillectomy    . Appendectomy    . Total abdominal hysterectomy w/ bilateral salpingoophorectomy    . Breast biopsy      benign nodule  . Cystoscopy    . Coronary angioplasty with stent placement      There were no vitals filed for this visit.  Visit Diagnosis:  Imbalance  Weakness  Right knee pain      Subjective Assessment - 08/30/15 1451    Subjective Per husband, pt loses balance when someone bumps onto her and she is unaware.    Pertinent History Balance difficulties. Husband thinks her balance might be due to medications. Just got out of blood pressure medication. Pt usually shaky in the mornings and pt cannot do too much because she might pass out. She has to sit for about 20 seconds before  standing up. Has to sit down at times. Pt husband also states pt having memory problems and vertigo.  Pt husband also states pt having baker's cyst behind R knee.  Pt states that when she gets dizzy, the room does not really spin. No falls past 6 months. Pt husband however states pt loses balance and catches her. Balance problems started 2 years ago. Uses a SPC at times.    Patient Stated Goals Improve balance   Currently in Pain? Yes  General aches and pains, and R posterior knee pain   Pain Score --  No pain level provided.    Multiple Pain Sites No       Objectives  There-ex   Directed patient with static standing balance with manual perturbation  Biodex balance: limits of stability program    Bilateral UE assist 2x,     no UE assist 2x,     then with moving platform level 12 for 2x Seated knee extension resisting 3 lbs 10x2 with 5 seconds Standing heel raises 10x 5 seconds for 3 sets Standing ankle DF 10x3   Improved exercise technique, movement at target joints, use of target muscles after mod verbal, visual, tactile cues.  Pt demonstrates good balance when she plans for the movement. Difficulty with weight shifting with biodex balance activity at first but improved with practice.                          PT Education - 08/30/15 1551    Education provided Yes   Education Details ther-ex   [employer]) Educated Patient   Methods Explanation;Demonstration;Tactile cues;Verbal cues   Comprehension Verbalized understanding;Returned demonstration             PT Long Term Goals - 08/25/15 2042    PT LONG TERM GOAL #1   Title Patient will improve her LEFS score by at least 9 points as a demonstration of improved function.    Baseline 48/80   Time 4   Period Weeks   Status New   PT LONG TERM GOAL #2   Title Patient will improve bilateral glute med and max strength by at least 1/2 MMT grade to promote ability to perform standing tasks.    Time 4    Period Weeks   Status New   PT LONG TERM GOAL #3   Title Patient will have a decrease in R knee pain to 3/10 or less at worst to promote ability to perform standing tasks.    Baseline 5/10   Time 4   Period Weeks   Status New               Plan - 08/30/15 1552    Clinical Impression Statement Pt demonstrates good balance when she plans for the movement. Difficulty with weight shifting with biodex balance activity at first but improved with practice.   Pt will benefit from skilled therapeutic intervention in order to improve on the following deficits Decreased strength;Pain;Decreased balance  Hx of fall per medical screening form   Rehab Potential Good   Clinical Impairments Affecting Rehab Potential age, mild dementia   PT Frequency 2x / week   PT Duration 4 weeks   PT Treatment/Interventions Therapeutic exercise;Manual techniques;Therapeutic activities;Iontophoresis /ml Dexamethasone;Patient/family education;Ultrasound;Balance training   PT Next Visit Plan Try Berg Balance Test. Hip strengthening.    Consulted and Agree with Plan of Care Patient;Family member/caregiver   Family Member Consulted husband        Problem List Patient Active Problem List   Diagnosis Date Noted  . CAD in native artery 06/02/2015  . Fibromyalgia 06/02/2015  . Presence of stent in coronary artery 05/12/2015  . Adaptive colitis 05/12/2015  . HLD (hyperlipidemia) 05/12/2015  . History of biliary T-tube placement 05/12/2015  . Fibrositis 05/12/2015  . Alzheimer's dementia, late onset 01/21/2015  . Depression, major, recurrent, in partial remission (HCC) 10/07/2014  . Depression, major, in remission (HCC) 10/07/2014  . Anxiety disorder 06/05/2014  . Amnesia 02/04/2014  . Accumulation of fluid in tissues 12/04/2013  . Adaptation reaction 12/04/2013  . Left flank pain 08/06/2013  . Calculus of kidney 09/23/2012  . ION (ischemic optic neuropathy) 09/23/2012  . Osteopenia 07/15/2012  .  BP (high blood pressure) 05/16/2012  . Atherosclerosis of coronary artery 05/15/2012    Loralyn Freshwater PT, DPT   08/30/2015, 3:57 PM  [place] [facility] PHYSICAL AND SPORTS MEDICINE [address] [place], [address] Phone: [phone]   Fax:  [phone]  Name: Sara Stout MRN: [id number] Date of Birth: May 12, 1931

## 2015-09-02 ENCOUNTER — Ambulatory Visit: Payer: Medicare Other

## 2015-09-02 DIAGNOSIS — R531 Weakness: Secondary | ICD-10-CM

## 2015-09-02 DIAGNOSIS — R2689 Other abnormalities of gait and mobility: Secondary | ICD-10-CM

## 2015-09-02 DIAGNOSIS — M25561 Pain in right knee: Secondary | ICD-10-CM

## 2015-09-02 NOTE — Patient Instructions (Addendum)
   Sitting on a chair, cross right leg over left knee to feel  a stretch behind your right hip. Hold for 15 seconds, repeat 5 times. Do this 2 times a day.    Sitting on a chair or side of bed, bend ankles up and down 30 times before getting up from a chair, or out of your bed

## 2015-09-02 NOTE — Therapy (Signed)
[place] [facility] [address], [place], [address] Phone: [phone]   Fax:  [phone]  Physical Therapy Treatment  Patient Details  Name: Sara Stout MRN: [id number] Date of Birth: December 26, 1930 Referring Provider: Cristopher Peru, MD  Encounter Date: 09/02/2015      PT End of Session - 09/02/15 1036    Visit Number 3   Number of Visits 9   Date for PT Re-Evaluation 09/23/15   Authorization Type 3   Authorization Time Period of 10   PT Start Time 1037   PT Stop Time 1120   PT Time Calculation (min) 43 min   Activity Tolerance Patient tolerated treatment well   Behavior During Therapy [facility] for tasks assessed/performed      Past Medical History  Diagnosis Date  . Coronary artery disease   . Diverticulosis   . UTI (lower urinary tract infection)   . Hematuria   . IBS (irritable bowel syndrome)   . Fibromyalgia   . Mild dementia   . Hypertension   . Osteoporosis   . Allergy   . Asthma   . Baker's cyst     R knee  . Fibromyalgia   . Coronary heart disease   . Anxiety   . Kidney stones     Past Surgical History  Procedure Laterality Date  . Angioplasty    . Cholecystectomy    . Abdominal hysterectomy    . Tonsillectomy    . Appendectomy    . Total abdominal hysterectomy w/ bilateral salpingoophorectomy    . Breast biopsy      benign nodule  . Cystoscopy    . Coronary angioplasty with stent placement      There were no vitals filed for this visit.  Visit Diagnosis:  Imbalance  Weakness  Right knee pain      Subjective Assessment - 09/02/15 1044    Subjective Pt husband states that after patient sits for too long, she takes a while to stand and puts her hand on her R hip. Pt states that his wife has seen the doctor for her R knee, but not taking her medication for her knee. Just takes medication for her cholesterol, and her nerves.  4/10 R hip pain after standing from sitting x 5 min.  6/10 posterior R knee pain when standing.   Pertinent History Balance difficulties. Husband thinks her balance might be due to medications. Just got out of blood pressure medication. Pt usually shaky in the mornings and pt cannot do too much because she might pass out. She has to sit for about 20 seconds before standing up. Has to sit down at times. Pt husband also states pt having memory problems and vertigo.  Pt husband also states pt having baker's cyst behind R knee.  Pt states that when she gets dizzy, the room does not really spin. No falls past 6 months. Pt husband however states pt loses balance and catches her. Balance problems started 2 years ago. Uses a SPC at times.    Patient Stated Goals Improve balance   Currently in Pain? Yes   Pain Score 6      Objectives:  There-ex  Listened and addressed pt husband concern with pt R hip pain/discomfort when standing up from a chair after prolonged sitting. Directed patient with standing trunk flexion, extension, R side bend, L side bend, R rotation, L rotation Supine manual knee to chest 1x each LE,  Supine manual hip  ER/IR in the 90/90 position 1x each LE Seated R piriformis stretch 15 seconds x5 (decreased R hip discomfort when standing) Seated bilateral ankle DF/PF 30x Reviewed HEP. Please see pt instructions.    Improved exercise technique, movement at target joints, use of target muscles after mod verbal, visual, tactile cues.     R posterior hip discomfort initially when pt stood up after sitting for about 5 min. No R posterior hip discomfort after seated R piriformis stretch. Pt also had difficulty standing up from the seated position at the table after performing supine to sit transfer secondary to light headedness. Pt able to stand up after sitting for about 15 more seconds.  Pt also demonstrates bilateral posterior hip tightness which may play a factor in R hip discomfort after standing from sitting about 5 minutes.            [facility] Assessment - 09/02/15 1051    Observation/Other Assessments   Observations R piriformis test: pressure to R lateral hip. R posterior hip discomfort with Ober test with knee bent. Long sit test suggests anterior nutation of L innominate.    Posture/Postural Control   Posture Comments Standing posture: R pelvic and trunk rotation   AROM   Lumbar Flexion WFL with low back, bilateral posterior hip, R posterior knee pain   Lumbar Extension limited with low back discomfort and bilateral posterior hip symptoms   Lumbar - Right Side Bend WFL with R posterior hip pain   Lumbar - Left Side Bend WFL with L posterior hip pain   Lumbar - Right Rotation WFL with R thoracic discomfort   Lumbar - Left Rotation WFL   PROM   Overall PROM Comments R hip IR WFL, R hip ER limited with reproduction of R posterior hip pain. L hip IR WFL, L hip ER limited with posterior L hip pressure. R knee to chest: posterior hip pain, R knee to L shoulder: posterior knee pain. L knee to chest: L anterior hip discomfort;  L knee to R shoulder: L anterior hip discomfort                             PT Education - 09/02/15 1051    Education provided Yes   Education Details ther-ex, HEP   Person(s) Educated Patient   Methods Explanation;Demonstration;Tactile cues;Verbal cues   Comprehension Verbalized understanding;Returned demonstration             PT Long Term Goals - 08/25/15 2042    PT LONG TERM GOAL #1   Title Patient will improve her LEFS score by at least 9 points as a demonstration of improved function.    Baseline 48/80   Time 4   Period Weeks   Status New   PT LONG TERM GOAL #2   Title Patient will improve bilateral glute med and max strength by at least 1/2 MMT grade to promote ability to perform standing tasks.    Time 4   Period Weeks   Status New   PT LONG TERM GOAL #3   Title Patient will have a decrease in R knee pain to 3/10 or less at worst to promote ability to  perform standing tasks.    Baseline 5/10   Time 4   Period Weeks   Status New               Plan - 09/02/15 1254    Clinical Impression Statement R posterior  hip discomfort initially when pt stood up after sitting for about 5 min. No R posterior hip discomfort after seated R piriformis stretch. Pt also had difficulty standing up from the seated position at the table after performing supine to sit transfer secondary to light headedness. Pt able to stand up after sitting for about 15 more seconds.  Pt also demonstrates bilateral posterior hip tightness which may play a factor in R hip discomfort after standing from sitting about 5 minutes.    Pt will benefit from skilled therapeutic intervention in order to improve on the following deficits Decreased strength;Pain;Decreased balance  Hx of fall per medical screening form   Rehab Potential Good   Clinical Impairments Affecting Rehab Potential age, mild dementia   PT Frequency 2x / week   PT Duration 4 weeks   PT Treatment/Interventions Therapeutic exercise;Manual techniques;Therapeutic activities;Iontophoresis /ml Dexamethasone;Patient/family education;Ultrasound;Balance training   PT Next Visit Plan Try Berg Balance Test. Hip strengthening.    Consulted and Agree with Plan of Care Patient;Family member/caregiver   Family Member Consulted husband        Problem List Patient Active Problem List   Diagnosis Date Noted  . CAD in native artery 06/02/2015  . Fibromyalgia 06/02/2015  . Presence of stent in coronary artery 05/12/2015  . Adaptive colitis 05/12/2015  . HLD (hyperlipidemia) 05/12/2015  . History of biliary T-tube placement 05/12/2015  . Fibrositis 05/12/2015  . Alzheimer's dementia, late onset 01/21/2015  . Depression, major, recurrent, in partial remission (HCC) 10/07/2014  . Depression, major, in remission (HCC) 10/07/2014  . Anxiety disorder 06/05/2014  . Amnesia 02/04/2014  . Accumulation of fluid in tissues  12/04/2013  . Adaptation reaction 12/04/2013  . Left flank pain 08/06/2013  . Calculus of kidney 09/23/2012  . ION (ischemic optic neuropathy) 09/23/2012  . Osteopenia 07/15/2012  . BP (high blood pressure) 05/16/2012  . Atherosclerosis of coronary artery 05/15/2012    Loralyn Freshwater PT, DPT   09/02/2015, 9:14 PM  [place] [facility] PHYSICAL AND SPORTS MEDICINE [address], [place], [address] Phone: [phone]   Fax:  [phone]  Name: Nell Gales MRN: [id number] Date of Birth: 12-09-1930

## 2015-09-08 ENCOUNTER — Ambulatory Visit: Payer: Medicare Other

## 2015-09-14 ENCOUNTER — Ambulatory Visit: Payer: Medicare Other | Attending: Neurology

## 2015-09-14 DIAGNOSIS — R2689 Other abnormalities of gait and mobility: Secondary | ICD-10-CM | POA: Insufficient documentation

## 2015-09-14 DIAGNOSIS — M25551 Pain in right hip: Secondary | ICD-10-CM | POA: Diagnosis present

## 2015-09-14 DIAGNOSIS — R531 Weakness: Secondary | ICD-10-CM

## 2015-09-14 DIAGNOSIS — M25561 Pain in right knee: Secondary | ICD-10-CM | POA: Insufficient documentation

## 2015-09-14 NOTE — Therapy (Signed)
[facility] PH[facility] MEDICINE [address] [place], [place], [address] Phone: [phone]   Fax:  [phone]  Physical Therapy Treatment  Patient Details  Name: Sara Stout MRN: [id number] Date of Birth: Mar 27, 1931 Referring Provider: Cristopher PeruHemang Shah, MD  Encounter Date: 09/14/2015      PT End of Session - 09/14/15 1312    Visit Number 4   Number of Visits 9   Date for PT Re-Evaluation 09/23/15   Authorization Type 4   Authorization Time Period of 10   PT Start Time 1305   PT Stop Time 1350   PT Time Calculation (min) 45 min   Equipment Utilized During Treatment Gait belt   Activity Tolerance Patient tolerated treatment well   Behavior During Therapy [id number] for tasks assessed/performed      Past Medical History  Diagnosis Date  . Coronary artery disease   . Diverticulosis   . UTI (lower urinary tract infection)   . Hematuria   . IBS (irritable bowel syndrome)   . Fibromyalgia   . Mild dementia   . Hypertension   . Osteoporosis   . Allergy   . Asthma   . Baker's cyst     R knee  . Fibromyalgia   . Coronary heart disease   . Anxiety   . Kidney stones     Past Surgical History  Procedure Laterality Date  . Angioplasty    . Cholecystectomy    . Abdominal hysterectomy    . Tonsillectomy    . Appendectomy    . Total abdominal hysterectomy w/ bilateral salpingoophorectomy    . Breast biopsy      benign nodule  . Cystoscopy    . Coronary angioplasty with stent placement      There were no vitals filed for this visit.  Visit Diagnosis:  Imbalance  Weakness      Subjective Assessment - 09/14/15 1309    Subjective Pt reports she is doing well today. She complains of some R knee pain upon arrival but reports history of Baker's cyst. Does not rate pain at this time. Pt and husband deny falls since last therapy session. Husband reports history of positive orthostatics. No specific questions or concerns at this time.     Pertinent History Balance difficulties. Husband thinks her balance might be due to medications. Just got out of blood pressure medication. Pt usually shaky in the mornings and pt cannot do too much because she might pass out. She has to sit for about 20 seconds before standing up. Has to sit down at times. Pt husband also states pt having memory problems and vertigo.  Pt husband also states pt having baker's cyst behind R knee.  Pt states that when she gets dizzy, the room does not really spin. No falls past 6 months. Pt husband however states pt loses balance and catches her. Balance problems started 2 years ago. Uses a SPC at times.    Patient Stated Goals Improve balance   Currently in Pain? Yes  Does not rate currently            [facility] PT Assessment - 09/14/15 1321    Standardized Balance Assessment   Standardized Balance Assessment Berg Balance Test   Berg Balance Test   Sit to Stand Able to stand without using hands and stabilize independently   Standing Unsupported Able to stand safely 2 minutes   Sitting with Back Unsupported but Feet Supported on Floor or Stool Able  to sit safely and securely 2 minutes   Stand to Sit Sits safely with minimal use of hands   Transfers Able to transfer safely, minor use of hands   Standing Unsupported with Eyes Closed Able to stand 10 seconds safely   Standing Ubsupported with Feet Together Able to place feet together independently and stand 1 minute safely   From Standing, Reach Forward with Outstretched Arm Can reach confidently >25 cm (10")   From Standing Position, Pick up Object from Floor Able to pick up shoe safely and easily   From Standing Position, Turn to Look Behind Over each Shoulder Looks behind from both sides and weight shifts well   Turn 360 Degrees Able to turn 360 degrees safely in 4 seconds or less   Standing Unsupported, Alternately Place Feet on Step/Stool Able to stand independently and safely and complete 8 steps in 20  seconds   Standing Unsupported, One Foot in Front Able to plae foot ahead of the other independently and hold 30 seconds   Standing on One Leg Able to lift leg independently and hold 5-10 seconds   Total Score 54       NEUROMUSCULAR RE-EDUCATION  Modified CTSIB: Conditions 1-3: >30 seconds, condition 4: 4 seconds; BERG: 54/56;  Airex eyes open/closed x 30 seconds each; Airex eyes open horizontal and vertical head turns x 30 seconds each; Airex toe taps to treadmill base alternating LE 30 seconds x 2; Rockerboard balance A/P and R/L x 30 seconds of static balance each; Balance Master training x 10 minutes with a variety of programs and varying difficulty settings with continual minA+1 from therapist and cues for correct completion of activities;  THER-EX Seated R piriformis stretching 45 seconds x 2;                         PT Education - 09/14/15 1311    Education provided Yes   Education Details ther-ex, reinforced HEP   Person(s) Educated Patient;Spouse   Methods Explanation   Comprehension Verbalized understanding             PT Long Term Goals - 08/25/15 2042    PT LONG TERM GOAL #1   Title Patient will improve her LEFS score by at least 9 points as a demonstration of improved function.    Baseline 48/80   Time 4   Period Weeks   Status New   PT LONG TERM GOAL #2   Title Patient will improve bilateral glute med and max strength by at least 1/2 MMT grade to promote ability to perform standing tasks.    Time 4   Period Weeks   Status New   PT LONG TERM GOAL #3   Title Patient will have a decrease in R knee pain to 3/10 or less at worst to promote ability to perform standing tasks.    Baseline 5/10   Time 4   Period Weeks   Status New               Plan - 09/14/15 1356    Clinical Impression Statement Pt demonstrates fair balance with BERG score of 54/56. Impairments in tandem and single leg balance. Modified CTSIB with loss of  balance after 4 seconds on foam with eyes closed (condition 4). Pt does not complain of increased pain throughout session. Pt encouraged to continue HEP and follow-up as scheduled.    Pt will benefit from skilled therapeutic intervention in order to improve  on the following deficits Decreased strength;Pain;Decreased balance  Hx of fall per medical screening form   Rehab Potential Good   Clinical Impairments Affecting Rehab Potential age, mild dementia   PT Frequency 2x / week   PT Duration 4 weeks   PT Treatment/Interventions Therapeutic exercise;Manual techniques;Therapeutic activities;Iontophoresis 4mg /ml Dexamethasone;Patient/family education;Ultrasound;Balance training   PT Next Visit Plan Progress LE strengthening as well as balance   PT Home Exercise Plan As prescribed   Consulted and Agree with Plan of Care Patient;Family member/caregiver   Family Member Consulted husband        Problem List Patient Active Problem List   Diagnosis Date Noted  . CAD in native artery 06/02/2015  . Fibromyalgia 06/02/2015  . Presence of stent in coronary artery 05/12/2015  . Adaptive colitis 05/12/2015  . HLD (hyperlipidemia) 05/12/2015  . History of biliary T-tube placement 05/12/2015  . Fibrositis 05/12/2015  . Alzheimer's dementia, late onset 01/21/2015  . Depression, major, recurrent, in partial remission (HCC) 10/07/2014  . Depression, major, in remission (HCC) 10/07/2014  . Anxiety disorder 06/05/2014  . Amnesia 02/04/2014  . Accumulation of fluid in tissues 12/04/2013  . Adaptation reaction 12/04/2013  . Left flank pain 08/06/2013  . Calculus of kidney 09/23/2012  . ION (ischemic optic neuropathy) 09/23/2012  . Osteopenia 07/15/2012  . BP (high blood pressure) 05/16/2012  . Atherosclerosis of coronary artery 05/15/2012   Lynnea Maizes PT, DPT   Huprich,Jason 09/14/2015, 1:58 PM  [facility] PHYSICAL AND SPORTS MEDICINE [address] [place], [address] Phone: [phone]   Fax:  [phone]  Name: Sara Stout MRN: [id number] Date of Birth: 02-20-1931

## 2015-09-16 ENCOUNTER — Ambulatory Visit: Payer: Medicare Other

## 2015-09-16 DIAGNOSIS — R531 Weakness: Secondary | ICD-10-CM

## 2015-09-16 DIAGNOSIS — R2689 Other abnormalities of gait and mobility: Secondary | ICD-10-CM | POA: Diagnosis not present

## 2015-09-16 NOTE — Therapy (Signed)
[place] [facility] [address] [place], [address] Phone: [phone]   Fax:  [phone]  Physical Therapy Treatment  Patient Details  Name: Sara Stout MRN: [id number] Date of Birth: 21-Mar-1931 Referring Provider: Cristopher Peru, MD  Encounter Date: 09/16/2015      PT End of Session - 09/16/15 1317    Visit Number 5   Number of Visits 9   Date for PT Re-Evaluation 09/23/15   Authorization Type 5   Authorization Time Period of 10   PT Start Time 1315   PT Stop Time 1345   PT Time Calculation (min) 30 min   Equipment Utilized During Treatment Gait belt   Activity Tolerance Patient tolerated treatment well   Behavior During Therapy [facility] for tasks assessed/performed      Past Medical History  Diagnosis Date  . Coronary artery disease   . Diverticulosis   . UTI (lower urinary tract infection)   . Hematuria   . IBS (irritable bowel syndrome)   . Fibromyalgia   . Mild dementia   . Hypertension   . Osteoporosis   . Allergy   . Asthma   . Baker's cyst     R knee  . Fibromyalgia   . Coronary heart disease   . Anxiety   . Kidney stones     Past Surgical History  Procedure Laterality Date  . Angioplasty    . Cholecystectomy    . Abdominal hysterectomy    . Tonsillectomy    . Appendectomy    . Total abdominal hysterectomy w/ bilateral salpingoophorectomy    . Breast biopsy      benign nodule  . Cystoscopy    . Coronary angioplasty with stent placement      There were no vitals filed for this visit.  Visit Diagnosis:  Imbalance  Weakness      Subjective Assessment - 09/16/15 1315    Subjective Pt reports she is doing well at this time however she is "achy all over." Pt reports "moderate" pain as she is unable to localize or rate pain on NPRS. No specific questions or concerns currently.    Pertinent History Balance difficulties. Husband thinks her balance might be due to medications. Just got  out of blood pressure medication. Pt usually shaky in the mornings and pt cannot do too much because she might pass out. She has to sit for about 20 seconds before standing up. Has to sit down at times. Pt husband also states pt having memory problems and vertigo.  Pt husband also states pt having baker's cyst behind R knee.  Pt states that when she gets dizzy, the room does not really spin. No falls past 6 months. Pt husband however states pt loses balance and catches her. Balance problems started 2 years ago. Uses a SPC at times.    Patient Stated Goals Improve balance       NEUROMUSCULAR RE-EDUCATION Airex WBOS eyes closed x 30 seconds each; Airex stone taps alternating LE 30 seconds x 2; Airex NBOS with horizontal weighted ball pass at varying heights 30 seconds x 2; Rockerboard A/P and R/L static balance x 30 seconds followed by weight shifting;  THER-EX NuStep L3 x 4 minutes for warm-up during history  Sit to stand x 10 without UE support; Single leg leg press 25# 2 x 10 bilateral; Resisted YTB sidestepping 20' x 4;  PT Education - 09/16/15 1316    Education provided Yes   Education Details Continue HEP   Person(s) Educated Patient   Methods Explanation   Comprehension Verbalized understanding             PT Long Term Goals - 08/25/15 2042    PT LONG TERM GOAL #1   Title Patient will improve her LEFS score by at least 9 points as a demonstration of improved function.    Baseline 48/80   Time 4   Period Weeks   Status New   PT LONG TERM GOAL #2   Title Patient will improve bilateral glute med and max strength by at least 1/2 MMT grade to promote ability to perform standing tasks.    Time 4   Period Weeks   Status New   PT LONG TERM GOAL #3   Title Patient will have a decrease in R knee pain to 3/10 or less at worst to promote ability to perform standing tasks.    Baseline 5/10   Time 4   Period Weeks   Status New                Plan - 09/16/15 1507    Clinical Impression Statement Unfortunately pt arrived late for her appointment so session shortened due to scheduling constraints. Pt demonstrates good progress with balance and strengthening. Husband present for entire treatment. Provided pt with RTB to use for resisted seated marches and seated clamshells. Follow-up as scheduled.    Pt will benefit from skilled therapeutic intervention in order to improve on the following deficits Decreased strength;Pain;Decreased balance  Hx of fall per medical screening form   Rehab Potential Good   Clinical Impairments Affecting Rehab Potential age, mild dementia   PT Frequency 2x / week   PT Duration 4 weeks   PT Treatment/Interventions Therapeutic exercise;Manual techniques;Therapeutic activities;Iontophoresis 4mg /ml Dexamethasone;Patient/family education;Ultrasound;Balance training   PT Next Visit Plan Progress LE strengthening as well as balance   PT Home Exercise Plan As prescribed   Consulted and Agree with Plan of Care Patient;Family member/caregiver   Family Member Consulted husband        Problem List Patient Active Problem List   Diagnosis Date Noted  . CAD in native artery 06/02/2015  . Fibromyalgia 06/02/2015  . Presence of stent in coronary artery 05/12/2015  . Adaptive colitis 05/12/2015  . HLD (hyperlipidemia) 05/12/2015  . History of biliary T-tube placement 05/12/2015  . Fibrositis 05/12/2015  . Alzheimer's dementia, late onset 01/21/2015  . Depression, major, recurrent, in partial remission (HCC) 10/07/2014  . Depression, major, in remission (HCC) 10/07/2014  . Anxiety disorder 06/05/2014  . Amnesia 02/04/2014  . Accumulation of fluid in tissues 12/04/2013  . Adaptation reaction 12/04/2013  . Left flank pain 08/06/2013  . Calculus of kidney 09/23/2012  . ION (ischemic optic neuropathy) 09/23/2012  . Osteopenia 07/15/2012  . BP (high blood pressure) 05/16/2012  .  Atherosclerosis of coronary artery 05/15/2012   Lynnea MaizesJason D Jenniferlynn Saad PT, DPT   Jushua Waltman 09/16/2015, 3:10 PM  [facility]INE [address] [place], [place], [address] Phone: [phone]   Fax:  [phone]  Name: Jackie PlumHelen Hickmon MRN: [id number] Date of Birth: 03/08/1931

## 2015-09-21 ENCOUNTER — Ambulatory Visit: Payer: Medicare Other

## 2015-09-28 ENCOUNTER — Ambulatory Visit: Payer: Medicare Other

## 2015-10-05 ENCOUNTER — Ambulatory Visit: Payer: Medicare Other

## 2015-10-05 DIAGNOSIS — M25551 Pain in right hip: Secondary | ICD-10-CM

## 2015-10-05 DIAGNOSIS — R2689 Other abnormalities of gait and mobility: Secondary | ICD-10-CM | POA: Diagnosis not present

## 2015-10-05 DIAGNOSIS — M25561 Pain in right knee: Secondary | ICD-10-CM

## 2015-10-05 DIAGNOSIS — R531 Weakness: Secondary | ICD-10-CM

## 2015-10-05 NOTE — Therapy (Signed)
[place] [facility] [address] [place], [address] Phone: [phone]   Fax:  [phone]  Physical Therapy Treatment And Progress Report  Patient Details  Name: Sara Stout MRN: [id number] Date of Birth: April 30, 1931 Referring Provider: Jennings Books, MD  Encounter Date: 10/05/2015      PT End of Session - 10/05/15 1527    Visit Number 6   Number of Visits 17   Date for PT Re-Evaluation 11/04/15   Authorization Type 6   Authorization Time Period of 10   PT Start Time 1528   PT Stop Time 6962   PT Time Calculation (min) 77 min   Equipment Utilized During Treatment Gait belt   Activity Tolerance Patient tolerated treatment well   Behavior During Therapy [facility] for tasks assessed/performed      Past Medical History  Diagnosis Date  . Coronary artery disease   . Diverticulosis   . UTI (lower urinary tract infection)   . Hematuria   . IBS (irritable bowel syndrome)   . Fibromyalgia   . Mild dementia   . Hypertension   . Osteoporosis   . Allergy   . Asthma   . Baker's cyst     R knee  . Fibromyalgia   . Coronary heart disease   . Anxiety   . Kidney stones     Past Surgical History  Procedure Laterality Date  . Angioplasty    . Cholecystectomy    . Abdominal hysterectomy    . Tonsillectomy    . Appendectomy    . Total abdominal hysterectomy w/ bilateral salpingoophorectomy    . Breast biopsy      benign nodule  . Cystoscopy    . Coronary angioplasty with stent placement      There were no vitals filed for this visit.  Visit Diagnosis:  Imbalance - Plan: PT plan of care cert/re-cert  Weakness - Plan: PT plan of care cert/re-cert  Right knee pain - Plan: PT plan of care cert/re-cert  Right hip pain - Plan: PT plan of care cert/re-cert      Subjective Assessment - 10/05/15 1535    Subjective Pt states feeling discomfort R knee (anterior medial). No pain level provided. Pt husband states  pt got an X-ray for her R knee around November 2016 which revealed arthritis. R hip hurts. 3/10 R hip and knee pain at worst for the past week.    Pertinent History Balance difficulties. Husband thinks her balance might be due to medications. Just got out of blood pressure medication. Pt usually shaky in the mornings and pt cannot do too much because she might pass out. She has to sit for about 20 seconds before standing up. Has to sit down at times. Pt husband also states pt having memory problems and vertigo.  Pt husband also states pt having baker's cyst behind R knee.  Pt states that when she gets dizzy, the room does not really spin. No falls past 6 months. Pt husband however states pt loses balance and catches her. Balance problems started 2 years ago. Uses a SPC at times.    Patient Stated Goals Improve balance   Currently in Pain? Yes   Pain Score --  No current pain level provided     3/10 R knee pain at most for the past week. 3/10 R posterior hip and R low back pain at most for the past week.        [place]  PT Assessment - 10/05/15 1553    Observation/Other Assessments   Observations decreased R femoral control when standing up from a chair.; (+) modified vertebral artery test R side resulting in blurred vision. Normal vision when pt returned to resting position.    Lower Extremity Functional Scale  38/80 (husband helped pt fill out form)   Strength   Right Hip Extension 4-/5   Right Hip ABduction 4/5  with groin and posterior lateral R hip pain with resistance   Left Hip Extension 4/5   Left Hip ABduction 4/5   Palpation   Palpation comment TTP  proximal and distal insertion of R and L piriformis muscle; bilateral greater trochanter, posterior and superior   Standardized Balance Assessment   Standardized Balance Assessment Berg Balance Test  Measured on September 14, 2015   Berg Balance Test   Sit to Stand Able to stand without using hands and stabilize independently   Standing  Unsupported Able to stand safely 2 minutes   Sitting with Back Unsupported but Feet Supported on Floor or Stool Able to sit safely and securely 2 minutes   Stand to Sit Sits safely with minimal use of hands   Transfers Able to transfer safely, minor use of hands   Standing Unsupported with Eyes Closed Able to stand 10 seconds safely   Standing Ubsupported with Feet Together Able to place feet together independently and stand 1 minute safely   From Standing, Reach Forward with Outstretched Arm Can reach confidently >25 cm (10")   From Standing Position, Pick up Object from Floor Able to pick up shoe safely and easily   From Standing Position, Turn to Look Behind Over each Shoulder Looks behind from both sides and weight shifts well   Turn 360 Degrees Able to turn 360 degrees safely in 4 seconds or less   Standing Unsupported, Alternately Place Feet on Step/Stool Able to stand independently and safely and complete 8 steps in 20 seconds   Standing Unsupported, One Foot in Front Able to plae foot ahead of the other independently and hold 30 seconds   Standing on One Leg Able to lift leg independently and hold 5-10 seconds   Total Score 54     Objectives  Swelling L knee, posterior L knee swelling palpated. TTP L anterior medial knee, proximal tibia Decreased R LE femoral control with sit <> stand  There-ex  Directed patient with manually resisted prone glute max extension, and S/L hip abduction 2-3x each way for each LE Reviewed progress/current status with hip strength with pt.   Directed patient with seated R piriformis stretch 10 seconds x 5 for 2 sets R LE  Seated hip adductor pillow squeeze 10x5 seconds for 2 sets  Sit <> stands from regular chair height  supine clam shell isometrics 10x3 with 5 second holds   supine transversus abdominis contraction 10x2 with 5 second holds   Dizziness with supine to sit, decreased after about 30 seconds  Obtained vitals:  BP L arm sitting  106/85, HR 73 (taken mechanically) BP L arm standing after 2 minutes 121/59, HR 85; 99/63, HR 76, 113/54, HR 79 (aforementioned blood pressure levels taken mechanically), 118/64 L arm standing (taken manually)    (+) modified vertebral artery test R side with blurred vision. Normal vision in neutral position. Pt and husband education on notifying her MD and getting clearance to continue physical therapy due to red flag, in order to try vestibular related exercises.    Improved exercise technique, movement  at target joints, use of target muscles after mod verbal, visual, tactile cues.           PT Education - 10/05/15 1542    Education provided Yes   Education Details ther-ex, progress/current status of bilateral hip strength   Person(s) Educated Patient   Methods Explanation;Demonstration;Tactile cues;Verbal cues   Comprehension Verbalized understanding;Returned demonstration             PT Long Term Goals - 10/05/15 1720    PT LONG TERM GOAL #1   Title Patient will improve her LEFS score by at least 9 points as a demonstration of improved function.    Baseline 48/80   Time 4   Period Weeks   Status On-going   PT LONG TERM GOAL #2   Title Patient will improve bilateral glute med and max strength by at least 1/2 MMT grade to promote ability to perform standing tasks.    Time 4   Period Weeks   Status Partially Met   PT LONG TERM GOAL #3   Title Patient will have a decrease in R knee pain to 3/10 or less at worst to promote ability to perform standing tasks.    Baseline 5/10   Time 4   Period Weeks   Status Achieved   PT LONG TERM GOAL #4   Title Pt will have a decrease in R knee and R hip pain to 2/10 or less at worst to promote ability to perform standing tasks as well as to improve ability to stand up from a sitting position.    Time 4   Period Weeks   Status New               Plan - 10/05/15 1542    Clinical Impression Statement Positive modified  vertebral artery test  (vertebral artery test performed in sitting) R side resulting in blurred  vision. Normal vision at neutral position. L side not tested secondary to being  positive on R side. Test performed secondary to dizziness. Pt was  recommended to get in touch with her MD to get clearance to continue with  physical therapy due to positive result/red flag.  When measuring blood  pressure in both sitting and standing positions, diastolic blood pressure  level dropped 20 mmHg. Patient scored well with balance testing including  the Berg Balance test taken on 09/14/2015, scoring 54/56. However, dizziness  observed today may play a factor in her overall balance difficulty.  Pt  has also demonstrated decreased R knee pain to 3/10 at worst and improved  L glute max strength since initial evaluation, meeting part of her long  term goals. Patient also states having R hip pain which also play a factor  in standing up from the seated position (per pt and husband). Barriers to  progress include not being able to attend prior physical therapy sessions  due to being sick as well as multiple health problems. Patient will  benefit from continued skilled physical therapy services to promote hip  strength, decrease R hip and knee pain, improve femoral control, and  improve function when cleared by her doctor.      Pt will benefit from skilled therapeutic intervention in order to improve on the following deficits Decreased strength;Pain;Decreased balance  Hx of fall per medical screening form   Rehab Potential Good   Clinical Impairments Affecting Rehab Potential age, mild dementia, multiple health problems   PT Frequency 2x / week   PT Duration 4 weeks  PT Treatment/Interventions Therapeutic exercise;Manual techniques;Therapeutic activities;Iontophoresis 77m/ml Dexamethasone;Patient/family education;Ultrasound;Balance training;Neuromuscular re-education;Canalith Repostioning;Vestibular   PT Next  Visit Plan strengthening, balance   PT Home Exercise Plan --   Consulted and Agree with Plan of Care Patient;Family member/caregiver   Family Member Consulted husband        Problem List Patient Active Problem List   Diagnosis Date Noted  . CAD in native artery 06/02/2015  . Fibromyalgia 06/02/2015  . Presence of stent in coronary artery 05/12/2015  . Adaptive colitis 05/12/2015  . HLD (hyperlipidemia) 05/12/2015  . History of biliary T-tube placement 05/12/2015  . Fibrositis 05/12/2015  . Alzheimer's dementia, late onset 01/21/2015  . Depression, major, recurrent, in partial remission ([facility] 10/07/2014  . Depression, major, in remission ([facility] 10/07/2014  . Anxiety disorder 06/05/2014  . Amnesia 02/04/2014  . Accumulation of fluid in tissues 12/04/2013  . Adaptation reaction 12/04/2013  . Left flank pain 08/06/2013  . Calculus of kidney 09/23/2012  . ION (ischemic optic neuropathy) 09/23/2012  . Osteopenia 07/15/2012  . BP (high blood pressure) 05/16/2012  . Atherosclerosis of coronary artery 05/15/2012    Thank you for your referral.    MJoneen BoersPT, DPT   10/05/2015, 7:53 PM  [facility] [address] [place] [address]Phone: [phone]  Fax:  [phone] Name: Sara VohsMRN: [id number] of Birth: [birth year]

## 2015-10-24 IMAGING — CT CT HEAD W/O CM
1 series · 16 of 29 positions shown, 20 images · non-contrast
Comparison: None.

CLINICAL DATA: Weakness. Altered mental status. The patient awoke
and was normal today. After lying down again for a short period, she
was confused.

EXAM:
CT HEAD WITHOUT CONTRAST
TECHNIQUE: Contiguous axial images were obtained from the base of the skull
through the vertex without intravenous contrast.

[Series 2: head wo · axial · 0.39mm/px · z∈[-4,+126]mm · 16 of 29 slices shown, 20 images]
[im 2/29  brain]
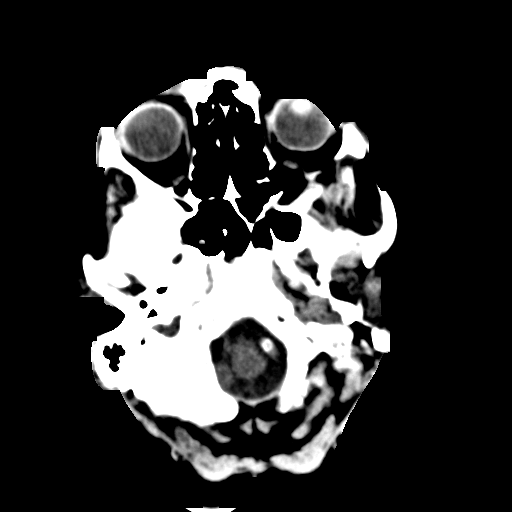
[im 2/29  bone]
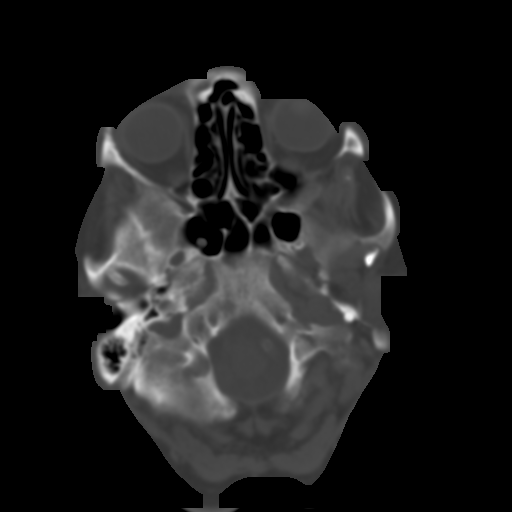
[im 4/29  brain]
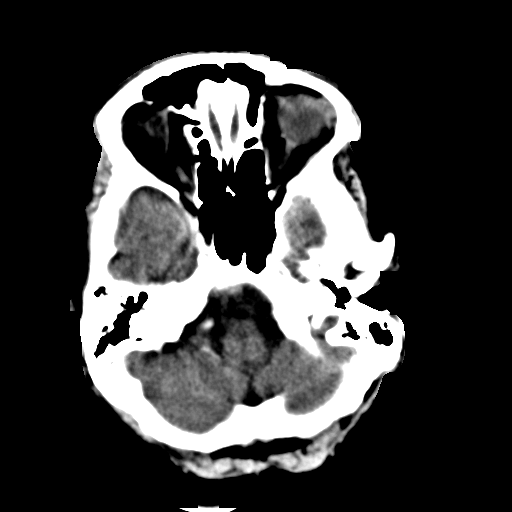
[im 6/29  brain]
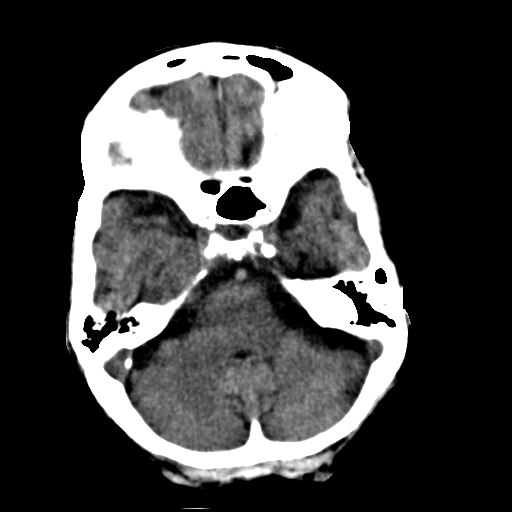
[im 7/29  brain]
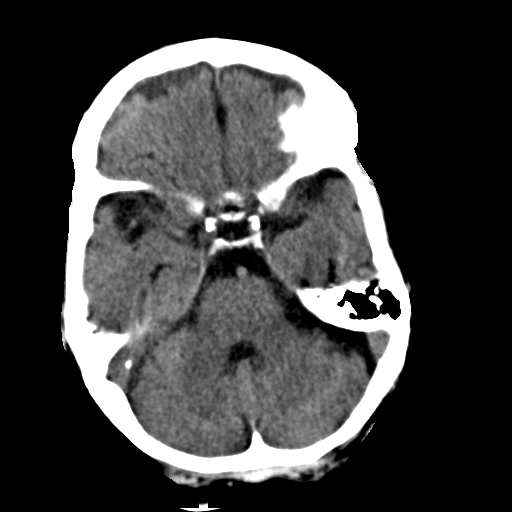
[im 9/29  brain]
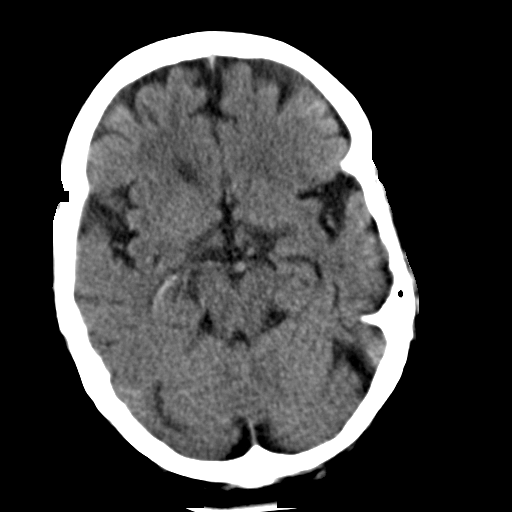
[im 9/29  bone]
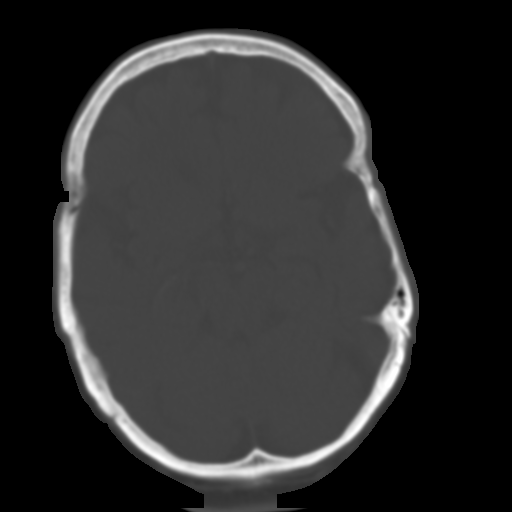
[im 11/29  brain]
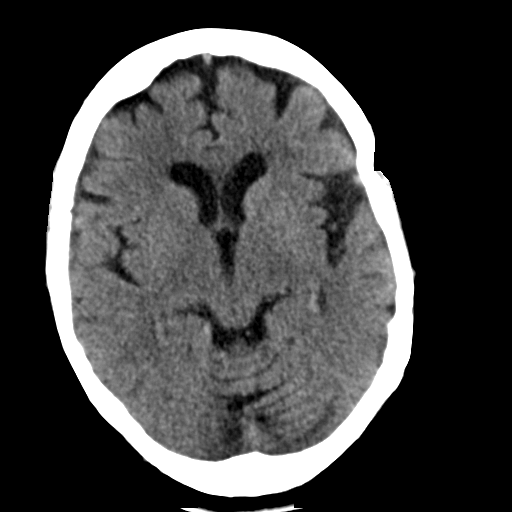
[im 12/29  brain]
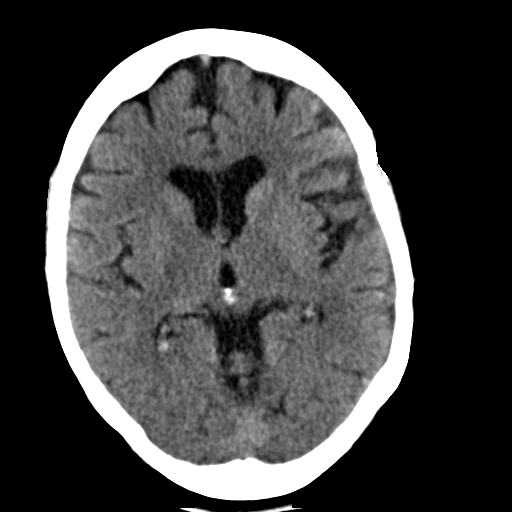
[im 14/29  brain]
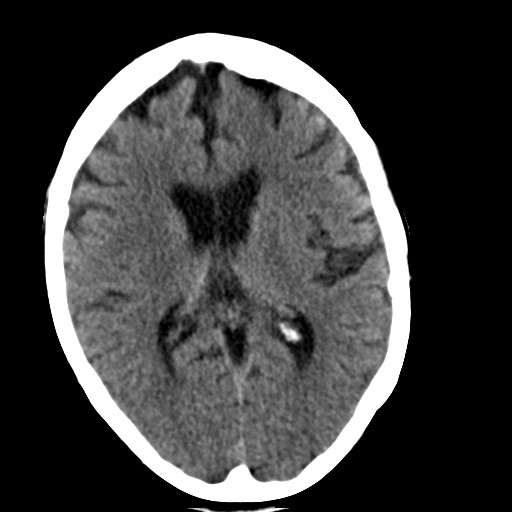
[im 16/29  brain]
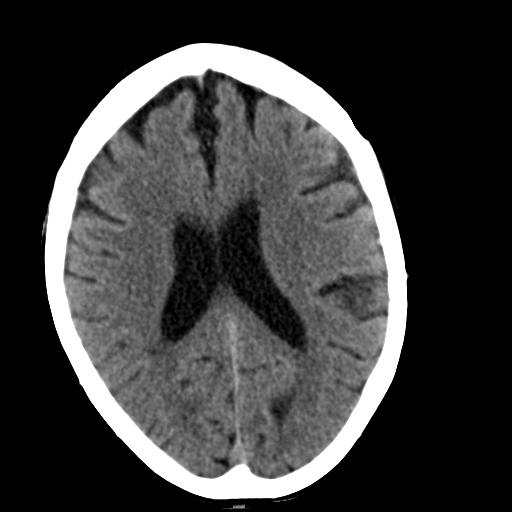
[im 16/29  bone]
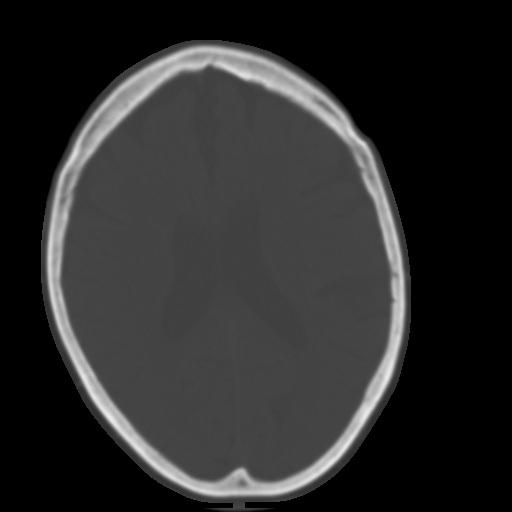
[im 18/29  brain]
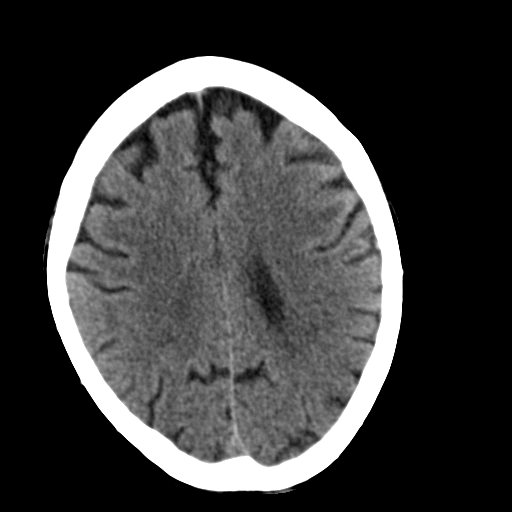
[im 19/29  brain]
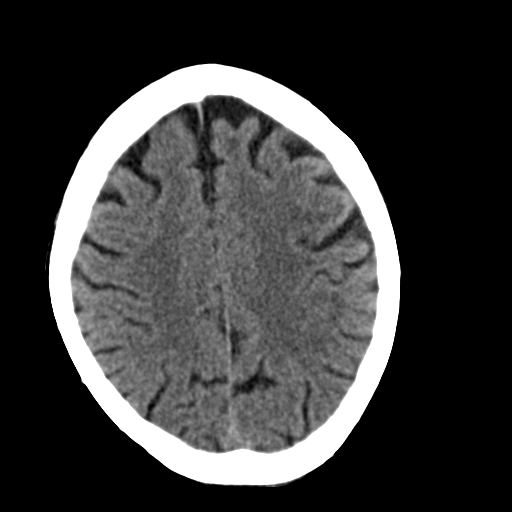
[im 21/29  brain]
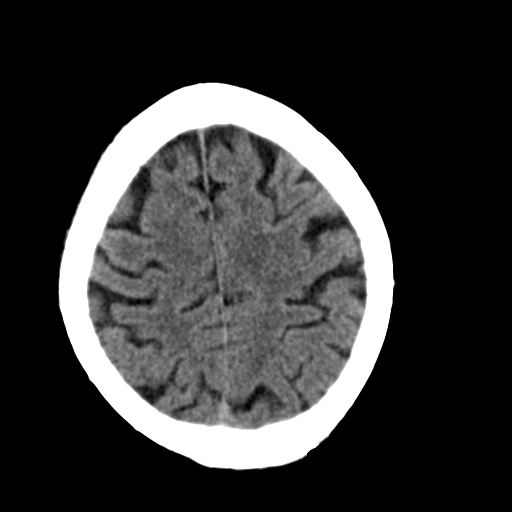
[im 23/29  brain]
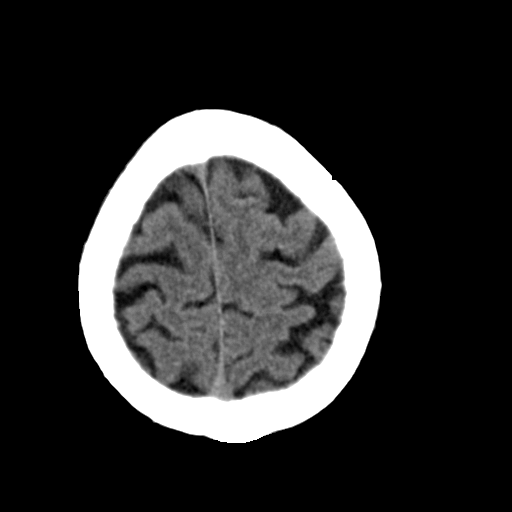
[im 23/29  bone]
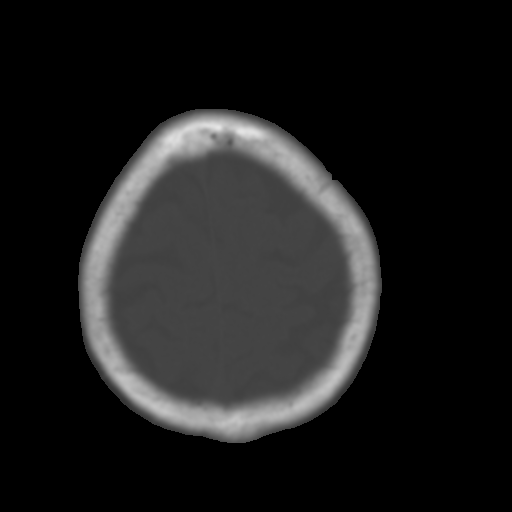
[im 24/29  brain]
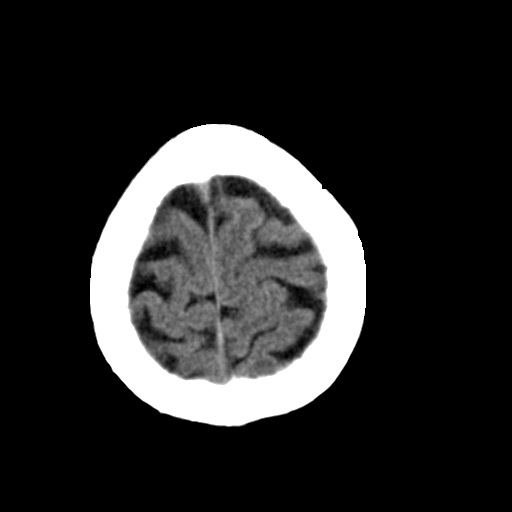
[im 26/29  brain]
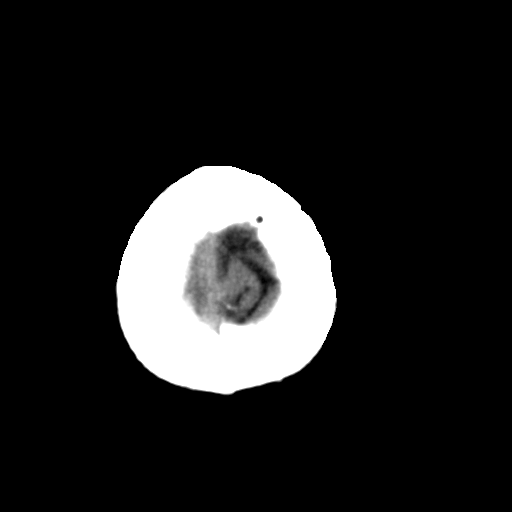
[im 28/29  brain]
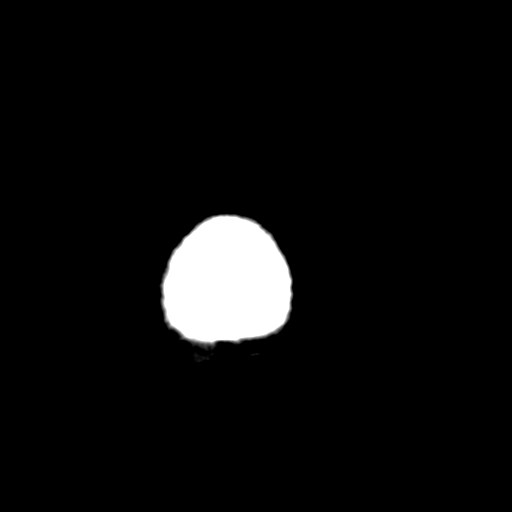

[16 of 29 positions shown; findings below may reference images not displayed]

FINDINGS: Mild generalized atrophy and white matter hypoattenuation is likely
within normal limits for age. No acute infarct, hemorrhage, or mass
lesion is present. The ventricles are of normal size. No significant
extraaxial fluid collection is present.

Vascular calcifications are present at the dural margin of the
vertebral arteries and within the cavernous internal carotid
arteries bilaterally. The paranasal sinuses and mastoid air cells
are clear. Calvarium is intact. The visualized globes and orbits are
within normal limits.
IMPRESSION: Negative CT of the head.

## 2016-02-02 ENCOUNTER — Other Ambulatory Visit: Payer: Self-pay | Admitting: Internal Medicine

## 2016-02-02 DIAGNOSIS — F0390 Unspecified dementia without behavioral disturbance: Secondary | ICD-10-CM

## 2016-02-02 DIAGNOSIS — R569 Unspecified convulsions: Secondary | ICD-10-CM

## 2016-02-23 ENCOUNTER — Ambulatory Visit
Admission: RE | Admit: 2016-02-23 | Discharge: 2016-02-23 | Disposition: A | Discharge status: Home / Self Care | Payer: Medicare (Managed Care) | Source: Ambulatory Visit | Attending: Internal Medicine | Admitting: Internal Medicine

## 2016-02-23 DIAGNOSIS — F0390 Unspecified dementia without behavioral disturbance: Secondary | ICD-10-CM | POA: Insufficient documentation

## 2016-02-23 DIAGNOSIS — R569 Unspecified convulsions: Secondary | ICD-10-CM | POA: Diagnosis present

## 2016-02-23 LAB — POCT I-STAT CREATININE: Creatinine, Ser: 0.8 mg/dL (ref 0.44–1.00)

## 2016-02-23 MED ORDER — GADOBENATE DIMEGLUMINE 529 MG/ML IV SOLN
15.0000 mL | Freq: Once | INTRAVENOUS | Status: AC | PRN
  Administered 2016-02-23: 14 mL via INTRAVENOUS

## 2016-10-10 ENCOUNTER — Encounter
Admission: RE | Admit: 2016-10-10 | Discharge: 2016-10-10 | Disposition: A | Discharge status: Home / Self Care | Payer: Medicare Other | Source: Ambulatory Visit | Attending: Internal Medicine | Admitting: Internal Medicine

## 2016-10-12 ENCOUNTER — Encounter
Admission: RE | Admit: 2016-10-12 | Discharge: 2016-10-12 | Disposition: A | Discharge status: Home / Self Care | Payer: Medicare Other | Source: Ambulatory Visit | Attending: Internal Medicine | Admitting: Internal Medicine

## 2016-10-12 DIAGNOSIS — R410 Disorientation, unspecified: Secondary | ICD-10-CM | POA: Insufficient documentation

## 2016-10-12 DIAGNOSIS — F419 Anxiety disorder, unspecified: Secondary | ICD-10-CM | POA: Insufficient documentation

## 2016-10-12 DIAGNOSIS — Z79899 Other long term (current) drug therapy: Secondary | ICD-10-CM | POA: Insufficient documentation

## 2016-10-17 DIAGNOSIS — F419 Anxiety disorder, unspecified: Secondary | ICD-10-CM | POA: Diagnosis not present

## 2016-10-17 DIAGNOSIS — R410 Disorientation, unspecified: Secondary | ICD-10-CM | POA: Diagnosis present

## 2016-10-17 DIAGNOSIS — Z79899 Other long term (current) drug therapy: Secondary | ICD-10-CM | POA: Diagnosis not present

## 2016-10-17 LAB — COMPREHENSIVE METABOLIC PANEL
ALT: 17 U/L (ref 14–54)
AST: 26 U/L (ref 15–41)
Albumin: 3.4 g/dL — ABNORMAL LOW (ref 3.5–5.0)
Alkaline Phosphatase: 71 U/L (ref 38–126)
Anion gap: 9 (ref 5–15)
BILIRUBIN TOTAL: 0.8 mg/dL (ref 0.3–1.2)
BUN: 13 mg/dL (ref 6–20)
CO2: 28 mmol/L (ref 22–32)
Calcium: 8.7 mg/dL — ABNORMAL LOW (ref 8.9–10.3)
Chloride: 105 mmol/L (ref 101–111)
Creatinine, Ser: 0.77 mg/dL (ref 0.44–1.00)
Glucose, Bld: 77 mg/dL (ref 65–99)
POTASSIUM: 4.3 mmol/L (ref 3.5–5.1)
Sodium: 142 mmol/L (ref 135–145)
TOTAL PROTEIN: 6.4 g/dL — AB (ref 6.5–8.1)

## 2016-10-17 LAB — CBC WITH DIFFERENTIAL/PLATELET
BASOS ABS: 0 10*3/uL (ref 0–0.1)
BASOS PCT: 1 %
Eosinophils Absolute: 0.1 10*3/uL (ref 0–0.7)
Eosinophils Relative: 3 %
HEMATOCRIT: 36.7 % (ref 35.0–47.0)
Hemoglobin: 12.6 g/dL (ref 12.0–16.0)
Lymphocytes Relative: 27 %
Lymphs Abs: 1.2 10*3/uL (ref 1.0–3.6)
MCH: 29.7 pg (ref 26.0–34.0)
MCHC: 34.5 g/dL (ref 32.0–36.0)
MCV: 86 fL (ref 80.0–100.0)
Monocytes Absolute: 0.7 10*3/uL (ref 0.2–0.9)
Monocytes Relative: 15 %
NEUTROS ABS: 2.6 10*3/uL (ref 1.4–6.5)
NEUTROS PCT: 54 %
Platelets: 175 10*3/uL (ref 150–440)
RBC: 4.26 MIL/uL (ref 3.80–5.20)
RDW: 14.5 % (ref 11.5–14.5)
WBC: 4.7 10*3/uL (ref 3.6–11.0)

## 2016-10-17 LAB — LIPID PANEL
CHOL/HDL RATIO: 2.8 ratio
Cholesterol: 119 mg/dL (ref 0–200)
HDL: 43 mg/dL (ref >40)
LDL CALC: 55 mg/dL (ref 0–99)
TRIGLYCERIDES: 103 mg/dL (ref <150)
VLDL: 21 mg/dL (ref 0–40)

## 2016-10-17 LAB — TSH: TSH: 1.18 u[IU]/mL (ref 0.350–4.500)

## 2016-10-17 LAB — MAGNESIUM: MAGNESIUM: 2.5 mg/dL — AB (ref 1.7–2.4)

## 2016-10-17 LAB — VITAMIN B12: VITAMIN B 12: 241 pg/mL (ref 180–914)

## 2016-10-18 LAB — VITAMIN D 25 HYDROXY (VIT D DEFICIENCY, FRACTURES): Vit D, 25-Hydroxy: 11.2 ng/mL — ABNORMAL LOW (ref 30.0–100.0)

## 2016-11-09 ENCOUNTER — Encounter
Admission: RE | Admit: 2016-11-09 | Discharge: 2016-11-09 | Disposition: A | Discharge status: Home / Self Care | Payer: Medicare Other | Source: Ambulatory Visit | Attending: Internal Medicine | Admitting: Internal Medicine

## 2016-12-10 ENCOUNTER — Encounter
Admission: RE | Admit: 2016-12-10 | Discharge: 2016-12-10 | Disposition: A | Discharge status: Home / Self Care | Payer: Medicare Other | Source: Ambulatory Visit | Attending: Internal Medicine | Admitting: Internal Medicine

## 2016-12-13 ENCOUNTER — Non-Acute Institutional Stay: Payer: Medicare Other | Admitting: Gerontology

## 2016-12-13 DIAGNOSIS — M8580 Other specified disorders of bone density and structure, unspecified site: Secondary | ICD-10-CM | POA: Diagnosis not present

## 2016-12-13 DIAGNOSIS — G301 Alzheimer's disease with late onset: Secondary | ICD-10-CM | POA: Diagnosis not present

## 2016-12-13 DIAGNOSIS — F3340 Major depressive disorder, recurrent, in remission, unspecified: Secondary | ICD-10-CM | POA: Diagnosis not present

## 2016-12-13 DIAGNOSIS — E785 Hyperlipidemia, unspecified: Secondary | ICD-10-CM | POA: Diagnosis not present

## 2016-12-13 DIAGNOSIS — M797 Fibromyalgia: Secondary | ICD-10-CM | POA: Diagnosis not present

## 2016-12-13 DIAGNOSIS — I25119 Atherosclerotic heart disease of native coronary artery with unspecified angina pectoris: Secondary | ICD-10-CM | POA: Diagnosis not present

## 2016-12-13 DIAGNOSIS — F419 Anxiety disorder, unspecified: Secondary | ICD-10-CM

## 2016-12-13 DIAGNOSIS — F0281 Dementia in other diseases classified elsewhere with behavioral disturbance: Secondary | ICD-10-CM | POA: Diagnosis not present

## 2016-12-13 DIAGNOSIS — E538 Deficiency of other specified B group vitamins: Secondary | ICD-10-CM | POA: Insufficient documentation

## 2016-12-13 DIAGNOSIS — I252 Old myocardial infarction: Secondary | ICD-10-CM | POA: Insufficient documentation

## 2016-12-13 DIAGNOSIS — M1990 Unspecified osteoarthritis, unspecified site: Secondary | ICD-10-CM | POA: Diagnosis not present

## 2016-12-13 NOTE — Progress Notes (Signed)
Location:      Place of Service:  ALF (13) Provider:  Lorenso Quarry, NP-C  [name]., MD  Patient Care Team: [name], MD as PCP - General (Internal Medicine)  Extended Emergency Contact Information Primary Emergency Contact: Heatwole,[name] Address: [address]          [place], [place] [address] [place] Home Phone: [phone] Mobile Phone: [phone] Relation: Spouse Secondary Emergency Contact: Lelon Perla [place] Home Phone: [phone] Work Phone: [phone] Mobile Phone: [phone] Relation: Niece  Code Status:  Full  Goals of care: Advanced Directive information Advanced Directives 08/25/2015  Does Patient Have a Medical Advance Directive? Yes  Type of Estate agent of [place];Living will  Does patient want to make changes to medical advance directive? No - Patient declined  Copy of Healthcare Power of Attorney in Chart? -     Chief Complaint  Patient presents with  . Medical Management of Chronic Issues    HPI:  [name] seen today for medical management of chronic diseases. Pt has been stable over the past month. No falls. Pt has intermittent crying episodes. She thinks her husband is leaving her and never coming back. Her memory is worsening, she cannot remember her husbands visits 10 minutes after he leaves. She realizes that she has poor short term memory, causing worsening distress. Pt does enjoy the company of others. She enjoys working puzzles. She performs ADLs independently. Continent of B&B. Feeds self. Complains of early morning pain in the hands that diminishes with activity. Appetite is good. Regular BMs. VSS. No other complaints.       Past Medical History:  Diagnosis Date  . Allergy   . Anxiety   . Asthma   . Baker's cyst    R knee  . Coronary artery disease   . Coronary heart disease   . Diverticulosis   . Fibromyalgia   .  Fibromyalgia   . Hematuria   . Hypertension   . IBS (irritable bowel syndrome)   . Kidney stones   . Mild dementia   . Osteoporosis   . UTI (lower urinary tract infection)    Past Surgical History:  Procedure Laterality Date  . ABDOMINAL HYSTERECTOMY    . ANGIOPLASTY    . APPENDECTOMY    . BREAST BIOPSY     benign nodule  . CHOLECYSTECTOMY    . CORONARY ANGIOPLASTY WITH STENT PLACEMENT    . CYSTOSCOPY    . TONSILLECTOMY    . TOTAL ABDOMINAL HYSTERECTOMY W/ BILATERAL SALPINGOOPHORECTOMY      Allergies  Allergen Reactions  . Epinephrine Other (See Comments)    Rapid heart beat.  . Dtap-Hepatitis B Recomb-Ipv Other (See Comments)  . Levofloxacin     Edema in knee  . Metronidazole     Nausea and edema in knee  . Tetanus Toxoids     Arm swelling    Allergies as of 12/13/2016      Reactions   Epinephrine Other (See Comments)   Rapid heart beat.   Dtap-hepatitis B Recomb-ipv Other (See Comments)   Levofloxacin    Edema in knee   Metronidazole    Nausea and edema in knee   Tetanus Toxoids    Arm swelling      Medication List       Accurate as of 12/13/16  4:00 PM. Always use your most recent med list.  aspirin 81 MG tablet Take 81 mg by mouth.   atorvastatin 80 MG tablet Commonly known as:  LIPITOR Take 80 mg by mouth at bedtime.   escitalopram 10 MG tablet Commonly known as:  LEXAPRO Take 10 mg by mouth at bedtime.   hydrocortisone cream 1 % Apply topically. Reported on 08/25/2015   meloxicam 15 MG tablet Commonly known as:  MOBIC Take by mouth. Reported on 08/25/2015   memantine 28 MG Cp24 24 hr capsule Commonly known as:  NAMENDA XR Take 28 mg by mouth at bedtime.   NITROSTAT 0.4 MG SL tablet Generic drug:  nitroGLYCERIN Place 1 tablet under the tongue.       Review of Systems  Constitutional: Negative for activity change, appetite change, chills, diaphoresis and fever.  HENT: Negative for congestion, sneezing, sore throat,  trouble swallowing and voice change.   Respiratory: Negative for apnea, cough, choking, chest tightness, shortness of breath and wheezing.   Cardiovascular: Negative for chest pain, palpitations and leg swelling.  Gastrointestinal: Negative for abdominal distention, abdominal pain, constipation, diarrhea and nausea.  Genitourinary: Negative for difficulty urinating, dysuria, frequency and urgency.  Musculoskeletal: Positive for arthralgias (typical arthritis). Negative for back pain, gait problem and myalgias.  Skin: Negative for color change, pallor, rash and wound.  Neurological: Negative for dizziness, tremors, syncope, speech difficulty, weakness, numbness and headaches.  Psychiatric/Behavioral: Positive for confusion. Negative for agitation and behavioral problems.  All other systems reviewed and are negative.    There is no immunization history on file for this patient. Pertinent  Health Maintenance Due  Topic Date Due  . DEXA SCAN  06/06/1998  . PNA vac Low Risk Adult (1 of 2 - PCV13) 06/06/1998  . INFLUENZA VACCINE  04/11/2017   No flowsheet data found. Functional Status Survey:    There were no vitals filed for this visit. There is no height or weight on file to calculate BMI. Physical Exam  Constitutional: She is oriented to person, place, and time. Vital signs are normal. She appears well-developed and well-nourished. She is active and cooperative. She does not appear ill. No distress.  HENT:  Head: Normocephalic and atraumatic.  Mouth/Throat: Uvula is midline, oropharynx is clear and moist and mucous membranes are normal. Mucous membranes are not pale, not dry and not cyanotic.  Eyes: Conjunctivae, EOM and lids are normal. Pupils are equal, round, and reactive to light.  Neck: Trachea normal, normal range of motion and full passive range of motion without pain. Neck supple. No JVD present. No tracheal deviation, no edema and no erythema present. No thyromegaly present.    Cardiovascular: Normal rate, regular rhythm, normal heart sounds, intact distal pulses and normal pulses.  Exam reveals no gallop, no distant heart sounds and no friction rub.   No murmur heard. Pulses:      Dorsalis pedis pulses are 2+ on the right side, and 2+ on the left side.  BLE mild non-pitting edema. Tender on palpation  Pulmonary/Chest: Effort normal and breath sounds normal. No accessory muscle usage. No respiratory distress. She has no decreased breath sounds. She has no wheezes. She has no rhonchi. She has no rales. She exhibits no tenderness.  Abdominal: Normal appearance and bowel sounds are normal. She exhibits no distension and no ascites. There is no tenderness.  Musculoskeletal: Normal range of motion. She exhibits no edema or tenderness.  Expected osteoarthritis, stiffness  Neurological: She is alert and oriented to person, place, and time. She has normal strength.  Skin: Skin  is warm, dry and intact. She is not diaphoretic. No cyanosis. No pallor. Nails show no clubbing.  Psychiatric: Her speech is normal and behavior is normal. Judgment and thought content normal. Her mood appears anxious. Cognition and memory are impaired. She exhibits abnormal recent memory.  Nursing note and vitals reviewed.   Labs reviewed:  Recent Labs  02/23/16 1320 10/17/16 0400  NA  --  142  K  --  4.3  CL  --  105  CO2  --  28  GLUCOSE  --  77  BUN  --  13  CREATININE 0.80 0.77  CALCIUM  --  8.7*  MG  --  2.5*    Recent Labs  10/17/16 0400  AST 26  ALT 17  ALKPHOS 71  BILITOT 0.8  PROT 6.4*  ALBUMIN 3.4*    Recent Labs  10/17/16 0400  WBC 4.7  NEUTROABS 2.6  HGB 12.6  HCT 36.7  MCV 86.0  PLT 175   Lab Results  Component Value Date   TSH 1.180 10/17/2016   No results found for: HGBA1C Lab Results  Component Value Date   CHOL 119 10/17/2016   HDL 43 10/17/2016   LDLCALC 55 10/17/2016   TRIG 103 10/17/2016   CHOLHDL 2.8 10/17/2016    Significant  Diagnostic Results in last 30 days:  No results found.  Assessment/Plan 1. Late onset Alzheimer's disease with behavioral disturbance  Stable  Decrease Namenda XR to 21 mg po Q Day  Continue galantamin 8 mg po BID  Continue Depakote Sprinkles 250 mg po BID for mood stabilization  2. Fibromyalgia  Stable   3. Atherosclerosis of native coronary artery of native heart with angina pectoris (HCC)  Stable  Continue ASA 81 mg po Q Day  Continue Nitroglycerin 0.4 mg tablet SL Q 5 minutes x 3 prn for angina  4. Old myocardial infarction  Stable   5. Major depressive disorder, recurrent, in remission, unspecified (HCC)  Stable  6. Anxiety disorder, unspecified type  Variable  Continue Trazodone 25 mg po BID  Reduce Namenda XR to 21 mg po Q Day to decrease stimulation  7. Hyperlipidemia, unspecified hyperlipidemia type  Stable/ resolved  DC Atorvastatin  8. Osteopenia, unspecified location  Stable  Continue Cholecalciferol 6,000 units po Q Day x 3 more weeks, then  Cholecalciferol 2,000 units po Q Day  9. Vitamin B12 deficiency  Stable  Continue Cyanocobalamin 1,000 mcg po Q Day  10. Osteoarthritis, unspecified  Tylenol 650 mg po BID  Family/ staff Communication:   Total Time:  Documentation:  Face to Face:  Family/Phone:   Labs/tests ordered:    Medication list reviewed and assessed for continued appropriateness. Monthly medication orders reviewed and signed.  Brynda Rim, NP-C Geriatrics [facility] [phone] [address], [place] [address] Cell Phone (Mon-Fri 8am-5pm):  [phone] On Call:  [phone] & follow prompts after 5pm & weekends Office Phone:  [phone] Office Fax:  [phone]

## 2017-01-01 ENCOUNTER — Non-Acute Institutional Stay: Payer: Medicare Other | Admitting: Gerontology

## 2017-01-01 DIAGNOSIS — F0281 Dementia in other diseases classified elsewhere with behavioral disturbance: Secondary | ICD-10-CM

## 2017-01-01 DIAGNOSIS — F419 Anxiety disorder, unspecified: Secondary | ICD-10-CM | POA: Diagnosis not present

## 2017-01-01 DIAGNOSIS — G301 Alzheimer's disease with late onset: Secondary | ICD-10-CM | POA: Diagnosis not present

## 2017-01-01 DIAGNOSIS — F02818 Dementia in other diseases classified elsewhere, unspecified severity, with other behavioral disturbance: Secondary | ICD-10-CM

## 2017-01-09 ENCOUNTER — Non-Acute Institutional Stay: Payer: Medicare Other | Admitting: Gerontology

## 2017-01-09 ENCOUNTER — Encounter
Admission: RE | Admit: 2017-01-09 | Discharge: 2017-01-09 | Disposition: A | Discharge status: Home / Self Care | Payer: Medicare Other | Source: Ambulatory Visit | Attending: Internal Medicine | Admitting: Internal Medicine

## 2017-01-09 DIAGNOSIS — F419 Anxiety disorder, unspecified: Secondary | ICD-10-CM | POA: Diagnosis not present

## 2017-01-09 DIAGNOSIS — I25119 Atherosclerotic heart disease of native coronary artery with unspecified angina pectoris: Secondary | ICD-10-CM

## 2017-01-09 DIAGNOSIS — M797 Fibromyalgia: Secondary | ICD-10-CM

## 2017-01-09 DIAGNOSIS — M8580 Other specified disorders of bone density and structure, unspecified site: Secondary | ICD-10-CM

## 2017-01-09 DIAGNOSIS — I252 Old myocardial infarction: Secondary | ICD-10-CM

## 2017-01-09 DIAGNOSIS — F0281 Dementia in other diseases classified elsewhere with behavioral disturbance: Secondary | ICD-10-CM

## 2017-01-09 DIAGNOSIS — G301 Alzheimer's disease with late onset: Secondary | ICD-10-CM

## 2017-01-09 DIAGNOSIS — F3340 Major depressive disorder, recurrent, in remission, unspecified: Secondary | ICD-10-CM

## 2017-01-09 DIAGNOSIS — F02818 Dementia in other diseases classified elsewhere, unspecified severity, with other behavioral disturbance: Secondary | ICD-10-CM

## 2017-01-09 DIAGNOSIS — M1990 Unspecified osteoarthritis, unspecified site: Secondary | ICD-10-CM | POA: Diagnosis not present

## 2017-01-09 DIAGNOSIS — E785 Hyperlipidemia, unspecified: Secondary | ICD-10-CM

## 2017-01-09 DIAGNOSIS — E538 Deficiency of other specified B group vitamins: Secondary | ICD-10-CM

## 2017-01-24 ENCOUNTER — Non-Acute Institutional Stay: Payer: Medicare Other | Admitting: Gerontology

## 2017-01-24 DIAGNOSIS — R059 Cough, unspecified: Secondary | ICD-10-CM

## 2017-01-24 DIAGNOSIS — R05 Cough: Secondary | ICD-10-CM

## 2017-01-24 NOTE — Progress Notes (Signed)
Location:      Place of Service:  ALF (13) Provider:  Lorenso Quarry, NP-C  [name], MD  Patient Care Team: [name], MD as PCP - General (Internal Medicine)  Extended Emergency Contact Information Primary Emergency Contact: Neuman,[name] Address: [address]          [place], [place] [address] Darden [place] of [place] Home Phone: [phone] Mobile Phone: [phone] Relation: Spouse Secondary Emergency Contact: Lelon Perla [place] Home Phone: [phone] Work Phone: [phone] Mobile Phone: [phone] Relation: Niece  Code Status:  full Goals of care: Advanced Directive information Advanced Directives 08/25/2015  Does Patient Have a Medical Advance Directive? Yes  Type of Estate agent of [place];Living will  Does patient want to make changes to medical advance directive? No - Patient declined  Copy of Healthcare Power of Attorney in Chart? -     Chief Complaint  Patient presents with  . Acute Visit    HPI:  [name] seen today for an acute visit for chest pains/ tachycardia. I was asked by pt's husband to see pt d/t her c/o tachycardia. Pt c/o intermittent but frequent bouts of tachycardia and panic. Pt has dementia and resides on the Memory Care unit. Husband comes to visit her daily. However, pt does not remember the visit 10 minutes after he leaves. She becomes fearful that he is "going to leave her" and is off with some one else. She becomes frightened at nighttime since he is not there "to protect her." She is able to report she begins having the feelings of tachycardia when she is looking for her husband. Pt has poor short term memory. Asked repeatedly during visit, "am I ok? You think I'm ok, don't you? I'm not really that forgetful. I'm not crazy. You don't think I'm crazy, do you?" Husband reports he had to take her phone away from her because she was calling him 40  times/ day at times and all through the night. Pt was mostly concerned with the tachycardia- afraid something was wrong with her heart. I explained multiple times that it is likely her anxiety causing the tachycardia. Husband was agreeable. Pt is also agreeable to "taking something for her nerves." Otherwise, pt is doing well. VSS. No other complaints.      Past Medical History:  Diagnosis Date  . Allergy   . Anxiety   . Asthma   . Baker's cyst    R knee  . Coronary artery disease   . Coronary heart disease   . Diverticulosis   . Fibromyalgia   . Fibromyalgia   . Hematuria   . Hypertension   . IBS (irritable bowel syndrome)   . Kidney stones   . Mild dementia   . Osteoporosis   . UTI (lower urinary tract infection)    Past Surgical History:  Procedure Laterality Date  . ABDOMINAL HYSTERECTOMY    . ANGIOPLASTY    . APPENDECTOMY    . BREAST BIOPSY     benign nodule  . CHOLECYSTECTOMY    . CORONARY ANGIOPLASTY WITH STENT PLACEMENT    . CYSTOSCOPY    . TONSILLECTOMY    . TOTAL ABDOMINAL HYSTERECTOMY W/ BILATERAL SALPINGOOPHORECTOMY      Allergies  Allergen Reactions  . Epinephrine Other (See Comments)    Rapid heart beat.  . Dtap-Hepatitis B Recomb-Ipv Other (See Comments)  . Levofloxacin     Edema in knee  . Metronidazole  Nausea and edema in knee  . Tetanus Toxoids     Arm swelling    Allergies as of 01/01/2017      Reactions   Epinephrine Other (See Comments)   Rapid heart beat.   Dtap-hepatitis B Recomb-ipv Other (See Comments)   Levofloxacin    Edema in knee   Metronidazole    Nausea and edema in knee   Tetanus Toxoids    Arm swelling      Medication List       Accurate as of 01/01/17 11:59 PM. Always use your most recent med list.          aspirin 81 MG tablet Take 81 mg by mouth.   atorvastatin 80 MG tablet Commonly known as:  LIPITOR Take 80 mg by mouth at bedtime.   escitalopram 10 MG tablet Commonly known as:  LEXAPRO Take 10 mg  by mouth at bedtime.   hydrocortisone cream 1 % Apply topically. Reported on 08/25/2015   meloxicam 15 MG tablet Commonly known as:  MOBIC Take by mouth. Reported on 08/25/2015   memantine 28 MG Cp24 24 hr capsule Commonly known as:  NAMENDA XR Take 28 mg by mouth at bedtime.   NITROSTAT 0.4 MG SL tablet Generic drug:  nitroGLYCERIN Place 1 tablet under the tongue.       Review of Systems  Constitutional: Negative for activity change, appetite change, chills, diaphoresis and fever.  HENT: Negative for congestion, sneezing, sore throat, trouble swallowing and voice change.   Respiratory: Negative for apnea, cough, choking, chest tightness, shortness of breath and wheezing.   Cardiovascular: Negative for chest pain, palpitations and leg swelling.  Gastrointestinal: Negative for abdominal distention, abdominal pain, constipation, diarrhea and nausea.  Genitourinary: Negative for difficulty urinating, dysuria, frequency and urgency.  Musculoskeletal: Positive for arthralgias (typical arthritis). Negative for back pain, gait problem and myalgias.  Skin: Negative for color change, pallor, rash and wound.  Neurological: Negative for dizziness, tremors, syncope, speech difficulty, weakness, numbness and headaches.  Psychiatric/Behavioral: Positive for agitation, confusion and sleep disturbance. Negative for behavioral problems.  All other systems reviewed and are negative.    There is no immunization history on file for this patient. Pertinent  Health Maintenance Due  Topic Date Due  . DEXA SCAN  06/06/1998  . PNA vac Low Risk Adult (1 of 2 - PCV13) 06/06/1998  . INFLUENZA VACCINE  04/11/2017   No flowsheet data found. Functional Status Survey:    Vitals:   12/13/16 1530  BP: 137/60  Pulse: 82  Temp: 97.1 F (36.2 C)  SpO2: 95%   There is no height or weight on file to calculate BMI. Physical Exam  Constitutional: She is oriented to person, place, and time. Vital signs  are normal. She appears well-developed and well-nourished. She is active and cooperative. She does not appear ill. No distress.  HENT:  Head: Normocephalic and atraumatic.  Mouth/Throat: Uvula is midline, oropharynx is clear and moist and mucous membranes are normal. Mucous membranes are not pale, not dry and not cyanotic.  Eyes: Conjunctivae, EOM and lids are normal. Pupils are equal, round, and reactive to light.  Neck: Trachea normal, normal range of motion and full passive range of motion without pain. Neck supple. No JVD present. No tracheal deviation, no edema and no erythema present. No thyromegaly present.  Cardiovascular: Normal rate, regular rhythm, normal heart sounds, intact distal pulses and normal pulses.  Exam reveals no gallop, no distant heart sounds and no friction rub.  No murmur heard. Pulses:      Dorsalis pedis pulses are 2+ on the right side, and 2+ on the left side.  BLE mild non-pitting edema. Tender on palpation  Pulmonary/Chest: Effort normal and breath sounds normal. No accessory muscle usage. No respiratory distress. She has no decreased breath sounds. She has no wheezes. She has no rhonchi. She has no rales. She exhibits no tenderness.  Abdominal: Normal appearance and bowel sounds are normal. She exhibits no distension and no ascites. There is no tenderness.  Musculoskeletal: Normal range of motion. She exhibits no edema or tenderness.  Expected osteoarthritis, stiffness  Neurological: She is alert and oriented to person, place, and time. She has normal strength.  Skin: Skin is warm, dry and intact. No rash noted. She is not diaphoretic. No cyanosis or erythema. No pallor. Nails show no clubbing.  Psychiatric: Her speech is normal and behavior is normal. Judgment and thought content normal. Her mood appears anxious. Cognition and memory are impaired. She exhibits abnormal recent memory.  Nursing note and vitals reviewed.   Labs reviewed:  Recent Labs   02/23/16 1320 10/17/16 0400  NA  --  142  K  --  4.3  CL  --  105  CO2  --  28  GLUCOSE  --  77  BUN  --  13  CREATININE 0.80 0.77  CALCIUM  --  8.7*  MG  --  2.5*    Recent Labs  10/17/16 0400  AST 26  ALT 17  ALKPHOS 71  BILITOT 0.8  PROT 6.4*  ALBUMIN 3.4*    Recent Labs  10/17/16 0400  WBC 4.7  NEUTROABS 2.6  HGB 12.6  HCT 36.7  MCV 86.0  PLT 175   Lab Results  Component Value Date   TSH 1.180 10/17/2016   No results found for: HGBA1C Lab Results  Component Value Date   CHOL 119 10/17/2016   HDL 43 10/17/2016   LDLCALC 55 10/17/2016   TRIG 103 10/17/2016   CHOLHDL 2.8 10/17/2016    Significant Diagnostic Results in last 30 days:  No results found.  Assessment/Plan 1. Late onset Alzheimer's disease with behavioral disturbance  Continue residency on Memory Unit  2. Anxiety disorder, unspecified type  Decrease Namenda XR to 21 mg  Team Health following  Encourage interaction with other residents   Family/ staff Communication:   Total Time: 65 minutes  Documentation: 15 minutes  Face to Face: 45 minutes  Family/Phone: 5 minutes- in the hallway   Labs/tests ordered:    Medication list reviewed and assessed for continued appropriateness.  Brynda RimShannon H. Shelvia Fojtik, NP-C Geriatrics [facility] [phone] [place], [place] [address] Cell Phone (Mon-Fri 8am-5pm):  [phone] On Call:  [phone] & follow prompts after 5pm & weekends Office Phone:  [phone] Office Fax:  [phone]

## 2017-01-24 NOTE — Progress Notes (Signed)
Location:      Place of Service:  ALF (13) Provider:  Lorenso Quarry, NP-C  [name], MD  Patient Care Team: [name], MD as PCP - General (Internal Medicine)  Extended Emergency Contact Information Primary Emergency Contact: Drennon,[name] Address: [address]          [place], [place] [address] Darden [place] of [place] Home Phone: [phone] Mobile Phone: [phone] Relation: Spouse Secondary Emergency Contact: Lelon Perla [place] Home Phone: [phone] Work Phone: [phone] Mobile Phone: [phone] Relation: Niece  Code Status:  Full  Goals of care: Advanced Directive information Advanced Directives 08/25/2015  Does Patient Have a Medical Advance Directive? Yes  Type of Estate agent of [place];Living will  Does patient want to make changes to medical advance directive? No - Patient declined  Copy of Healthcare Power of Attorney in Chart? -     Chief Complaint  Patient presents with  . Follow-up    HPI:  [name] seen today for medical management of chronic diseases. Pt has been stable over the past month. Medication adjustments have been made for the anxiety and mood disorder. No falls. Pt continues to have intermittent crying episodes. She thinks her husband is leaving her and never coming back. But, this has improved some. Her memory is worsening, she cannot remember her husbands visits 10 minutes after he leaves. She realizes that she has poor short term memory, causing worsening distress. Pt does enjoy the company of others. She enjoys working puzzles. She performs ADLs independently. Continent of B&B. Feeds self. Complains of early morning pain in the hands that diminishes with activity. Appetite is good. Regular BMs. VSS. No other complaints.       Past Medical History:  Diagnosis Date  . Allergy   . Anxiety   . Asthma   . Baker's cyst    R knee  . Coronary  artery disease   . Coronary heart disease   . Diverticulosis   . Fibromyalgia   . Fibromyalgia   . Hematuria   . Hypertension   . IBS (irritable bowel syndrome)   . Kidney stones   . Mild dementia   . Osteoporosis   . UTI (lower urinary tract infection)    Past Surgical History:  Procedure Laterality Date  . ABDOMINAL HYSTERECTOMY    . ANGIOPLASTY    . APPENDECTOMY    . BREAST BIOPSY     benign nodule  . CHOLECYSTECTOMY    . CORONARY ANGIOPLASTY WITH STENT PLACEMENT    . CYSTOSCOPY    . TONSILLECTOMY    . TOTAL ABDOMINAL HYSTERECTOMY W/ BILATERAL SALPINGOOPHORECTOMY      Allergies  Allergen Reactions  . Epinephrine Other (See Comments)    Rapid heart beat.  . Dtap-Hepatitis B Recomb-Ipv Other (See Comments)  . Levofloxacin     Edema in knee  . Metronidazole     Nausea and edema in knee  . Tetanus Toxoids     Arm swelling    Allergies as of 01/09/2017      Reactions   Epinephrine Other (See Comments)   Rapid heart beat.   Dtap-hepatitis B Recomb-ipv Other (See Comments)   Levofloxacin    Edema in knee   Metronidazole    Nausea and edema in knee   Tetanus Toxoids    Arm swelling      Medication List       Accurate as of 01/09/17 11:59  PM. Always use your most recent med list.          aspirin 81 MG tablet Take 81 mg by mouth.   atorvastatin 80 MG tablet Commonly known as:  LIPITOR Take 80 mg by mouth at bedtime.   escitalopram 10 MG tablet Commonly known as:  LEXAPRO Take 10 mg by mouth at bedtime.   hydrocortisone cream 1 % Apply topically. Reported on 08/25/2015   meloxicam 15 MG tablet Commonly known as:  MOBIC Take by mouth. Reported on 08/25/2015   memantine 28 MG Cp24 24 hr capsule Commonly known as:  NAMENDA XR Take 28 mg by mouth at bedtime.   NITROSTAT 0.4 MG SL tablet Generic drug:  nitroGLYCERIN Place 1 tablet under the tongue.       Review of Systems  Constitutional: Negative for activity change, appetite change, chills,  diaphoresis and fever.  HENT: Negative for congestion, sneezing, sore throat, trouble swallowing and voice change.   Respiratory: Negative for apnea, cough, choking, chest tightness, shortness of breath and wheezing.   Cardiovascular: Negative for chest pain, palpitations and leg swelling.  Gastrointestinal: Negative for abdominal distention, abdominal pain, constipation, diarrhea and nausea.  Genitourinary: Negative for difficulty urinating, dysuria, frequency and urgency.  Musculoskeletal: Positive for arthralgias (typical arthritis). Negative for back pain, gait problem and myalgias.  Skin: Negative for color change, pallor, rash and wound.  Neurological: Negative for dizziness, tremors, syncope, speech difficulty, weakness, numbness and headaches.  Psychiatric/Behavioral: Positive for confusion. Negative for agitation and behavioral problems.  All other systems reviewed and are negative.    There is no immunization history on file for this patient. Pertinent  Health Maintenance Due  Topic Date Due  . DEXA SCAN  06/06/1998  . PNA vac Low Risk Adult (1 of 2 - PCV13) 06/06/1998  . INFLUENZA VACCINE  04/11/2017   No flowsheet data found. Functional Status Survey:    Vitals:   12/13/16 1530  BP: 137/60  Pulse: 82  SpO2: 95%   There is no height or weight on file to calculate BMI. Physical Exam  Constitutional: She is oriented to person, place, and time. Vital signs are normal. She appears well-developed and well-nourished. She is active and cooperative. She does not appear ill. No distress.  HENT:  Head: Normocephalic and atraumatic.  Mouth/Throat: Uvula is midline, oropharynx is clear and moist and mucous membranes are normal. Mucous membranes are not pale, not dry and not cyanotic.  Eyes: Conjunctivae, EOM and lids are normal. Pupils are equal, round, and reactive to light.  Neck: Trachea normal, normal range of motion and full passive range of motion without pain. Neck supple.  No JVD present. No tracheal deviation, no edema and no erythema present. No thyromegaly present.  Cardiovascular: Normal rate, regular rhythm, normal heart sounds, intact distal pulses and normal pulses.  Exam reveals no gallop, no distant heart sounds and no friction rub.   No murmur heard. Pulses:      Dorsalis pedis pulses are 2+ on the right side, and 2+ on the left side.  BLE mild non-pitting edema. Tender on palpation  Pulmonary/Chest: Effort normal and breath sounds normal. No accessory muscle usage. No respiratory distress. She has no decreased breath sounds. She has no wheezes. She has no rhonchi. She has no rales. She exhibits no tenderness.  Abdominal: Normal appearance and bowel sounds are normal. She exhibits no distension and no ascites. There is no tenderness.  Musculoskeletal: Normal range of motion. She exhibits no edema or  tenderness.  Expected osteoarthritis, stiffness  Neurological: She is alert and oriented to person, place, and time. She has normal strength.  Skin: Skin is warm, dry and intact. She is not diaphoretic. No cyanosis. No pallor. Nails show no clubbing.  Psychiatric: Her speech is normal and behavior is normal. Judgment and thought content normal. Her mood appears anxious. Cognition and memory are impaired. She exhibits abnormal recent memory.  Nursing note and vitals reviewed.   Labs reviewed:  Recent Labs  02/23/16 1320 10/17/16 0400  NA  --  142  K  --  4.3  CL  --  105  CO2  --  28  GLUCOSE  --  77  BUN  --  13  CREATININE 0.80 0.77  CALCIUM  --  8.7*  MG  --  2.5*    Recent Labs  10/17/16 0400  AST 26  ALT 17  ALKPHOS 71  BILITOT 0.8  PROT 6.4*  ALBUMIN 3.4*    Recent Labs  10/17/16 0400  WBC 4.7  NEUTROABS 2.6  HGB 12.6  HCT 36.7  MCV 86.0  PLT 175   Lab Results  Component Value Date   TSH 1.180 10/17/2016   No results found for: HGBA1C Lab Results  Component Value Date   CHOL 119 10/17/2016   HDL 43 10/17/2016    LDLCALC 55 10/17/2016   TRIG 103 10/17/2016   CHOLHDL 2.8 10/17/2016    Significant Diagnostic Results in last 30 days:  No results found.  Assessment/Plan 1. Late onset Alzheimer's disease with behavioral disturbance  Stable  Decrease Namenda XR to 14 mg po Q Day  Continue galantamin 8 mg po BID  Continue Depakote Sprinkles 375 mg po BID for mood stabilization  2. Fibromyalgia  Stable   3. Atherosclerosis of native coronary artery of native heart with angina pectoris (HCC)  Stable  Continue ASA 81 mg po Q Day  Continue Nitroglycerin 0.4 mg tablet SL Q 5 minutes x 3 prn for angina  4. Old myocardial infarction  Stable   5. Major depressive disorder, recurrent, in remission, unspecified (HCC)  Stable  Lexapro 10 mg po Q Day  6. Anxiety disorder, unspecified type  Variable  Continue Trazodone 25 mg po BID  Reduce Namenda XR to 14 mg po Q Day to decrease stimulation  Depakote sprinkles 375 mg po BID for mood stabilization  7. Hyperlipidemia, unspecified hyperlipidemia type  Stable/ resolved  DC Atorvastatin  8. Osteopenia, unspecified location  Stable  Continue Cholecalciferol 6,000 units po Q Day x 3 more weeks, then  Cholecalciferol 2,000 units po Q Day  9. Vitamin B12 deficiency  Stable  Continue Cyanocobalamin 1,000 mcg po Q Day  10. Osteoarthritis, unspecified  Continue Tylenol 650 mg po BID  Family/ staff Communication:   Total Time:  Documentation:  Face to Face:  Family/Phone:   Labs/tests ordered:    Medication list reviewed and assessed for continued appropriateness. Monthly medication orders reviewed and signed.  Brynda RimShannon H. Shafer Swamy, NP-C Geriatrics [facility] [phone] [address] [place], [place] [address] Cell Phone (Mon-Fri 8am-5pm):  [phone] On Call:  [phone] & follow prompts after 5pm & weekends Office Phone:  [phone] Office Fax:  [phone]

## 2017-01-24 NOTE — Progress Notes (Signed)
Location:      Place of Service:  ALF (13) Provider:  Lorenso Quarry, NP-C  [name], MD  Patient Care Team: [name], MD as PCP - General (Internal Medicine)  Extended Emergency Contact Information Primary Emergency Contact: Eisner,[name] Address: [address]          [place], [place] [address] [place] of [place] Home Phone: [phone] Mobile Phone: [phone] Relation: Spouse Secondary Emergency Contact: Lelon Perla [place] Home Phone: [phone] Work Phone: [phone] Mobile Phone: [phone] Relation: Niece  Code Status:  full Goals of care: Advanced Directive information Advanced Directives 08/25/2015  Does Patient Have a Medical Advance Directive? Yes  Type of Estate agent of [place];Living will  Does patient want to make changes to medical advance directive? No - Patient declined  Copy of Healthcare Power of Attorney in Chart? -     Chief Complaint  Patient presents with  . Acute Visit    HPI:  [name] seen today for an acute visit for cough. Husband was concerned she has had a cough for several days. He also had a cough and went to his MD and received antibiotics. Husband wanted pt to get abt also. However, nursing had initiated Guaifenesin at onset of cough. Staff described cough, at first, as congested, loud, deep cough. Staff report that since starting the Guaifenesin, her cough has diminished/ is much improved. Pt has been afebrile. She does report she does still have an occasional cough after feeling a "tickle" in her throat. She occasionally expectorates clear secretions. She also reports her cough is mostly in the early morning, upon awakening and when she "gets moving around." She acknowledges presence of pollens/allergies this time of year and this is likely the cause of the cough. Pt denies other sx, such as n/v/d/f/c/cp/sob/ha/abd pain/dizziness/pain.  VSS. No other complaints. Pt went to hair appointment right after the interview.    Past Medical History:  Diagnosis Date  . Allergy   . Anxiety   . Asthma   . Baker's cyst    R knee  . Coronary artery disease   . Coronary heart disease   . Diverticulosis   . Fibromyalgia   . Fibromyalgia   . Hematuria   . Hypertension   . IBS (irritable bowel syndrome)   . Kidney stones   . Mild dementia   . Osteoporosis   . UTI (lower urinary tract infection)    Past Surgical History:  Procedure Laterality Date  . ABDOMINAL HYSTERECTOMY    . ANGIOPLASTY    . APPENDECTOMY    . BREAST BIOPSY     benign nodule  . CHOLECYSTECTOMY    . CORONARY ANGIOPLASTY WITH STENT PLACEMENT    . CYSTOSCOPY    . TONSILLECTOMY    . TOTAL ABDOMINAL HYSTERECTOMY W/ BILATERAL SALPINGOOPHORECTOMY      Allergies  Allergen Reactions  . Epinephrine Other (See Comments)    Rapid heart beat.  . Dtap-Hepatitis B Recomb-Ipv Other (See Comments)  . Levofloxacin     Edema in knee  . Metronidazole     Nausea and edema in knee  . Tetanus Toxoids     Arm swelling    Allergies as of 01/24/2017      Reactions   Epinephrine Other (See Comments)   Rapid heart beat.   Dtap-hepatitis B Recomb-ipv Other (See Comments)   Levofloxacin    Edema in knee   Metronidazole  Nausea and edema in knee   Tetanus Toxoids    Arm swelling      Medication List       Accurate as of 01/24/17 10:58 AM. Always use your most recent med list.          aspirin 81 MG tablet Take 81 mg by mouth.   atorvastatin 80 MG tablet Commonly known as:  LIPITOR Take 80 mg by mouth at bedtime.   escitalopram 10 MG tablet Commonly known as:  LEXAPRO Take 10 mg by mouth at bedtime.   hydrocortisone cream 1 % Apply topically. Reported on 08/25/2015   meloxicam 15 MG tablet Commonly known as:  MOBIC Take by mouth. Reported on 08/25/2015   memantine 28 MG Cp24 24 hr capsule Commonly known as:  NAMENDA XR Take 28 mg by mouth  at bedtime.   NITROSTAT 0.4 MG SL tablet Generic drug:  nitroGLYCERIN Place 1 tablet under the tongue.       Review of Systems  Constitutional: Negative for activity change, appetite change, chills, diaphoresis and fever.  HENT: Negative for congestion, sneezing, sore throat, trouble swallowing and voice change.   Respiratory: Negative for apnea, cough, choking, chest tightness, shortness of breath and wheezing.   Cardiovascular: Negative for chest pain, palpitations and leg swelling.  Gastrointestinal: Negative for abdominal distention, abdominal pain, constipation, diarrhea and nausea.  Genitourinary: Negative for difficulty urinating, dysuria, frequency and urgency.  Musculoskeletal: Positive for arthralgias (typical arthritis). Negative for back pain, gait problem and myalgias.  Skin: Negative for color change, pallor, rash and wound.  Neurological: Negative for dizziness, tremors, syncope, speech difficulty, weakness, numbness and headaches.  Psychiatric/Behavioral: Negative for agitation, behavioral problems and confusion.  All other systems reviewed and are negative.    There is no immunization history on file for this patient. Pertinent  Health Maintenance Due  Topic Date Due  . DEXA SCAN  06/06/1998  . PNA vac Low Risk Adult (1 of 2 - PCV13) 06/06/1998  . INFLUENZA VACCINE  04/11/2017   No flowsheet data found. Functional Status Survey:    There were no vitals filed for this visit. There is no height or weight on file to calculate BMI. Physical Exam  Constitutional: She is oriented to person, place, and time. Vital signs are normal. She appears well-developed and well-nourished. She is active and cooperative. She does not appear ill. No distress.  HENT:  Head: Normocephalic and atraumatic.  Mouth/Throat: Uvula is midline, oropharynx is clear and moist and mucous membranes are normal. Mucous membranes are not pale, not dry and not cyanotic.  Eyes: Conjunctivae, EOM  and lids are normal. Pupils are equal, round, and reactive to light.  Neck: Trachea normal, normal range of motion and full passive range of motion without pain. Neck supple. No JVD present. No tracheal deviation, no edema and no erythema present. No thyromegaly present.  Cardiovascular: Normal rate, regular rhythm, normal heart sounds, intact distal pulses and normal pulses.  Exam reveals no gallop, no distant heart sounds and no friction rub.   No murmur heard. Pulses:      Dorsalis pedis pulses are 2+ on the right side, and 2+ on the left side.  BLE mild non-pitting edema. Tender on palpation  Pulmonary/Chest: Effort normal and breath sounds normal. No accessory muscle usage. No respiratory distress. She has no decreased breath sounds. She has no wheezes. She has no rhonchi. She has no rales. She exhibits no tenderness.  Abdominal: Normal appearance and bowel sounds are normal.  She exhibits no distension and no ascites. There is no tenderness.  Musculoskeletal: Normal range of motion. She exhibits no edema or tenderness.  Expected osteoarthritis, stiffness  Neurological: She is alert and oriented to person, place, and time. She has normal strength.  Skin: Skin is warm, dry and intact. She is not diaphoretic. No cyanosis. No pallor. Nails show no clubbing.  Psychiatric: Her speech is normal and behavior is normal. Judgment and thought content normal. Her mood appears anxious. Cognition and memory are impaired. She exhibits abnormal recent memory.  Nursing note and vitals reviewed.   Labs reviewed:  Recent Labs  02/23/16 1320 10/17/16 0400  NA  --  142  K  --  4.3  CL  --  105  CO2  --  28  GLUCOSE  --  77  BUN  --  13  CREATININE 0.80 0.77  CALCIUM  --  8.7*  MG  --  2.5*    Recent Labs  10/17/16 0400  AST 26  ALT 17  ALKPHOS 71  BILITOT 0.8  PROT 6.4*  ALBUMIN 3.4*    Recent Labs  10/17/16 0400  WBC 4.7  NEUTROABS 2.6  HGB 12.6  HCT 36.7  MCV 86.0  PLT 175    Lab Results  Component Value Date   TSH 1.180 10/17/2016   No results found for: HGBA1C Lab Results  Component Value Date   CHOL 119 10/17/2016   HDL 43 10/17/2016   LDLCALC 55 10/17/2016   TRIG 103 10/17/2016   CHOLHDL 2.8 10/17/2016    Significant Diagnostic Results in last 30 days:  No results found.  Assessment/Plan 1. Cough  Continue guaifenesin prn  Encourage po fluid intake  Atrovent 0.03%- 2 sprays in each nostril Q 12 hour x 3 weeks for PND  Family/ staff Communication:   Total Time:  Documentation:  Face to Face:  Family/Phone:   Labs/tests ordered:    Medication list reviewed and assessed for continued appropriateness.  Brynda Rim, NP-C Geriatrics [facility] [phone] [address], [place] [address] Cell Phone (Mon-Fri 8am-5pm):  [phone] On Call:  [phone] & follow prompts after 5pm & weekends Office Phone:  [phone] Office Fax:  [phone]

## 2017-02-09 ENCOUNTER — Encounter
Admission: RE | Admit: 2017-02-09 | Discharge: 2017-02-09 | Disposition: A | Discharge status: Home / Self Care | Payer: Medicare Other | Source: Ambulatory Visit | Attending: Internal Medicine | Admitting: Internal Medicine

## 2017-02-14 ENCOUNTER — Non-Acute Institutional Stay: Payer: Medicare Other | Admitting: Gerontology

## 2017-02-14 DIAGNOSIS — M1990 Unspecified osteoarthritis, unspecified site: Secondary | ICD-10-CM

## 2017-02-14 DIAGNOSIS — F3340 Major depressive disorder, recurrent, in remission, unspecified: Secondary | ICD-10-CM | POA: Diagnosis not present

## 2017-02-14 DIAGNOSIS — E538 Deficiency of other specified B group vitamins: Secondary | ICD-10-CM | POA: Diagnosis not present

## 2017-02-14 DIAGNOSIS — G301 Alzheimer's disease with late onset: Secondary | ICD-10-CM | POA: Diagnosis not present

## 2017-02-14 DIAGNOSIS — F419 Anxiety disorder, unspecified: Secondary | ICD-10-CM

## 2017-02-14 DIAGNOSIS — F0281 Dementia in other diseases classified elsewhere with behavioral disturbance: Secondary | ICD-10-CM

## 2017-02-14 DIAGNOSIS — F02818 Dementia in other diseases classified elsewhere, unspecified severity, with other behavioral disturbance: Secondary | ICD-10-CM

## 2017-02-14 DIAGNOSIS — M8580 Other specified disorders of bone density and structure, unspecified site: Secondary | ICD-10-CM

## 2017-02-14 DIAGNOSIS — E785 Hyperlipidemia, unspecified: Secondary | ICD-10-CM | POA: Diagnosis not present

## 2017-02-14 DIAGNOSIS — M797 Fibromyalgia: Secondary | ICD-10-CM | POA: Diagnosis not present

## 2017-02-14 DIAGNOSIS — I25119 Atherosclerotic heart disease of native coronary artery with unspecified angina pectoris: Secondary | ICD-10-CM | POA: Diagnosis not present

## 2017-02-14 DIAGNOSIS — I252 Old myocardial infarction: Secondary | ICD-10-CM | POA: Diagnosis not present

## 2017-03-04 NOTE — Progress Notes (Signed)
Location:      Place of Service:  ALF (13) Provider:  Lorenso Quarry, NP-C  [name], MD  Patient Care Team: [name], MD as PCP - General (Internal Medicine)  Extended Emergency Contact Information Primary Emergency Contact: Muchmore,[name] Address: [address]          [place], [place] [address] [place] of [place] Home Phone: [phone] Mobile Phone: [phone] Relation: Spouse Secondary Emergency Contact: Lelon Perla [place] Home Phone: [phone] Work Phone: [phone] Mobile Phone: [phone] Relation: Niece  Code Status:  Full  Goals of care: Advanced Directive information Advanced Directives 08/25/2015  Does Patient Have a Medical Advance Directive? Yes  Type of Estate agent of [place];Living will  Does patient want to make changes to medical advance directive? No - Patient declined  Copy of Healthcare Power of Attorney in Chart? -     Chief Complaint  Patient presents with  . Medical Management of Chronic Issues    HPI:  [name] seen today for medical management of chronic diseases. Pt has been stable over the past month. Medication adjustments have been made for the anxiety and mood disorder. No falls. Pt continues to have intermittent crying episodes. She thinks her husband is leaving her and never coming back. But, this has improved. Her memory is worsening, she cannot remember her husbands visits 10 minutes after he leaves. She realizes that she has poor short term memory, causing worsening distress. Pt does enjoy the company of others. She enjoys working puzzles. She performs ADLs independently. Continent of B&B. Feeds self. Complains of early morning pain in the hands that diminishes with activity. Appetite is good. Regular BMs. VSS. No other complaints.       Past Medical History:  Diagnosis Date  . Allergy   . Anxiety   . Asthma   . Baker's cyst    R  knee  . Coronary artery disease   . Coronary heart disease   . Diverticulosis   . Fibromyalgia   . Fibromyalgia   . Hematuria   . Hypertension   . IBS (irritable bowel syndrome)   . Kidney stones   . Mild dementia   . Osteoporosis   . UTI (lower urinary tract infection)    Past Surgical History:  Procedure Laterality Date  . ABDOMINAL HYSTERECTOMY    . ANGIOPLASTY    . APPENDECTOMY    . BREAST BIOPSY     benign nodule  . CHOLECYSTECTOMY    . CORONARY ANGIOPLASTY WITH STENT PLACEMENT    . CYSTOSCOPY    . TONSILLECTOMY    . TOTAL ABDOMINAL HYSTERECTOMY W/ BILATERAL SALPINGOOPHORECTOMY      Allergies  Allergen Reactions  . Epinephrine Other (See Comments)    Rapid heart beat.  . Dtap-Hepatitis B Recomb-Ipv Other (See Comments)  . Levofloxacin     Edema in knee  . Metronidazole     Nausea and edema in knee  . Tetanus Toxoids     Arm swelling    Allergies as of 02/14/2017      Reactions   Epinephrine Other (See Comments)   Rapid heart beat.   Dtap-hepatitis B Recomb-ipv Other (See Comments)   Levofloxacin    Edema in knee   Metronidazole    Nausea and edema in knee   Tetanus Toxoids    Arm swelling      Medication List       Accurate as  of 02/14/17 11:59 PM. Always use your most recent med list.          aspirin 81 MG tablet Take 81 mg by mouth.   atorvastatin 80 MG tablet Commonly known as:  LIPITOR Take 80 mg by mouth at bedtime.   escitalopram 10 MG tablet Commonly known as:  LEXAPRO Take 10 mg by mouth at bedtime.   hydrocortisone cream 1 % Apply topically. Reported on 08/25/2015   meloxicam 15 MG tablet Commonly known as:  MOBIC Take by mouth. Reported on 08/25/2015   memantine 28 MG Cp24 24 hr capsule Commonly known as:  NAMENDA XR Take 28 mg by mouth at bedtime.   NITROSTAT 0.4 MG SL tablet Generic drug:  nitroGLYCERIN Place 1 tablet under the tongue.       Review of Systems  Constitutional: Negative for activity change,  appetite change, chills, diaphoresis and fever.  HENT: Negative for congestion, sneezing, sore throat, trouble swallowing and voice change.   Respiratory: Negative for apnea, cough, choking, chest tightness, shortness of breath and wheezing.   Cardiovascular: Negative for chest pain, palpitations and leg swelling.  Gastrointestinal: Negative for abdominal distention, abdominal pain, constipation, diarrhea and nausea.  Genitourinary: Negative for difficulty urinating, dysuria, frequency and urgency.  Musculoskeletal: Positive for arthralgias (typical arthritis). Negative for back pain, gait problem and myalgias.  Skin: Negative for color change, pallor, rash and wound.  Neurological: Negative for dizziness, tremors, syncope, speech difficulty, weakness, numbness and headaches.  Psychiatric/Behavioral: Positive for confusion. Negative for agitation and behavioral problems.  All other systems reviewed and are negative.    There is no immunization history on file for this patient. Pertinent  Health Maintenance Due  Topic Date Due  . DEXA SCAN  06/06/1998  . PNA vac Low Risk Adult (1 of 2 - PCV13) 06/06/1998  . INFLUENZA VACCINE  04/11/2017   No flowsheet data found. Functional Status Survey:    Vitals:   01/15/17 1400  BP: 129/62  Pulse: 73  Resp: 18  Temp: 99.1 F (37.3 C)  SpO2: 93%   There is no height or weight on file to calculate BMI. Physical Exam  Constitutional: She is oriented to person, place, and time. Vital signs are normal. She appears well-developed and well-nourished. She is active and cooperative. She does not appear ill. No distress.  HENT:  Head: Normocephalic and atraumatic.  Mouth/Throat: Uvula is midline, oropharynx is clear and moist and mucous membranes are normal. Mucous membranes are not pale, not dry and not cyanotic.  Eyes: Conjunctivae, EOM and lids are normal. Pupils are equal, round, and reactive to light.  Neck: Trachea normal, normal range of  motion and full passive range of motion without pain. Neck supple. No JVD present. No tracheal deviation, no edema and no erythema present. No thyromegaly present.  Cardiovascular: Normal rate, regular rhythm, normal heart sounds, intact distal pulses and normal pulses.  Exam reveals no gallop, no distant heart sounds and no friction rub.   No murmur heard. Pulses:      Dorsalis pedis pulses are 2+ on the right side, and 2+ on the left side.  BLE mild non-pitting edema. Tender on palpation  Pulmonary/Chest: Effort normal and breath sounds normal. No accessory muscle usage. No respiratory distress. She has no decreased breath sounds. She has no wheezes. She has no rhonchi. She has no rales. She exhibits no tenderness.  Abdominal: Normal appearance and bowel sounds are normal. She exhibits no distension and no ascites. There is no  tenderness.  Musculoskeletal: Normal range of motion. She exhibits no edema or tenderness.  Expected osteoarthritis, stiffness  Neurological: She is alert and oriented to person, place, and time. She has normal strength.  Skin: Skin is warm, dry and intact. She is not diaphoretic. No cyanosis. No pallor. Nails show no clubbing.  Psychiatric: Her speech is normal and behavior is normal. Judgment and thought content normal. Her mood appears anxious. Cognition and memory are impaired. She exhibits abnormal recent memory.  Nursing note and vitals reviewed.   Labs reviewed:  Recent Labs  10/17/16 0400  NA 142  K 4.3  CL 105  CO2 28  GLUCOSE 77  BUN 13  CREATININE 0.77  CALCIUM 8.7*  MG 2.5*    Recent Labs  10/17/16 0400  AST 26  ALT 17  ALKPHOS 71  BILITOT 0.8  PROT 6.4*  ALBUMIN 3.4*    Recent Labs  10/17/16 0400  WBC 4.7  NEUTROABS 2.6  HGB 12.6  HCT 36.7  MCV 86.0  PLT 175   Lab Results  Component Value Date   TSH 1.180 10/17/2016   No results found for: HGBA1C Lab Results  Component Value Date   CHOL 119 10/17/2016   HDL 43  10/17/2016   LDLCALC 55 10/17/2016   TRIG 103 10/17/2016   CHOLHDL 2.8 10/17/2016    Significant Diagnostic Results in last 30 days:  No results found.  Assessment/Plan 1. Late onset Alzheimer's disease with behavioral disturbance  Stable  Decrease Namenda XR to 14 mg po Q Day  Continue galantamin 8 mg po BID  Continue Depakote Sprinkles 375 mg po BID for mood stabilization  2. Fibromyalgia  Stable   3. Atherosclerosis of native coronary artery of native heart with angina pectoris (HCC)  Stable  Continue ASA 81 mg po Q Day  Continue Nitroglycerin 0.4 mg tablet SL Q 5 minutes x 3 prn for angina  4. Old myocardial infarction  Stable   5. Major depressive disorder, recurrent, in remission, unspecified (HCC)  Stable  Lexapro 10 mg po Q Day  6. Anxiety disorder, unspecified type  Variable  Continue Trazodone 25 mg po BID  Reduce Namenda XR to 14 mg po Q Day to decrease stimulation  Depakote sprinkles 375 mg po BID for mood stabilization  7. Hyperlipidemia, unspecified hyperlipidemia type  Stable/ resolved  DC Atorvastatin  8. Osteopenia, unspecified location  Stable  Continue Cholecalciferol 6,000 units po Q Day x 3 more weeks, then  Cholecalciferol 2,000 units po Q Day  9. Vitamin B12 deficiency  Stable  Continue Cyanocobalamin 1,000 mcg po Q Day  10. Osteoarthritis, unspecified  Continue Tylenol 650 mg po BID  Family/ staff Communication:   Total Time:  Documentation:  Face to Face:  Family/Phone:   Labs/tests ordered:    Medication list reviewed and assessed for continued appropriateness. Monthly medication orders reviewed and signed.  Brynda RimShannon H. Mahima Hottle, NP-C Geriatrics [facility] [phone] [address] [place] [place] [place], [place] [address] Cell Phone (Mon-Fri 8am-5pm):  [phone] On Call:  [phone] & follow prompts after 5pm & weekends Office Phone:  [phone] Office Fax:  [phone]

## 2017-03-11 ENCOUNTER — Encounter
Admission: RE | Admit: 2017-03-11 | Discharge: 2017-03-11 | Disposition: A | Discharge status: Home / Self Care | Payer: Medicare Other | Source: Ambulatory Visit | Attending: Internal Medicine | Admitting: Internal Medicine

## 2017-04-11 ENCOUNTER — Encounter
Admission: RE | Admit: 2017-04-11 | Discharge: 2017-04-11 | Disposition: A | Discharge status: Home / Self Care | Payer: Medicare Other | Source: Ambulatory Visit | Attending: Internal Medicine | Admitting: Internal Medicine

## 2017-04-16 ENCOUNTER — Encounter: Payer: Self-pay | Admitting: Gerontology

## 2017-04-16 ENCOUNTER — Non-Acute Institutional Stay: Payer: Medicare Other | Admitting: Gerontology

## 2017-04-16 DIAGNOSIS — M8580 Other specified disorders of bone density and structure, unspecified site: Secondary | ICD-10-CM | POA: Diagnosis not present

## 2017-04-16 DIAGNOSIS — I252 Old myocardial infarction: Secondary | ICD-10-CM | POA: Diagnosis not present

## 2017-04-16 DIAGNOSIS — F0281 Dementia in other diseases classified elsewhere with behavioral disturbance: Secondary | ICD-10-CM | POA: Diagnosis not present

## 2017-04-16 DIAGNOSIS — I25119 Atherosclerotic heart disease of native coronary artery with unspecified angina pectoris: Secondary | ICD-10-CM

## 2017-04-16 DIAGNOSIS — M797 Fibromyalgia: Secondary | ICD-10-CM

## 2017-04-16 DIAGNOSIS — G301 Alzheimer's disease with late onset: Secondary | ICD-10-CM | POA: Diagnosis not present

## 2017-04-16 DIAGNOSIS — F3340 Major depressive disorder, recurrent, in remission, unspecified: Secondary | ICD-10-CM

## 2017-04-16 DIAGNOSIS — M1990 Unspecified osteoarthritis, unspecified site: Secondary | ICD-10-CM | POA: Diagnosis not present

## 2017-04-16 DIAGNOSIS — E785 Hyperlipidemia, unspecified: Secondary | ICD-10-CM

## 2017-04-16 DIAGNOSIS — F419 Anxiety disorder, unspecified: Secondary | ICD-10-CM | POA: Diagnosis not present

## 2017-04-16 NOTE — Progress Notes (Signed)
[facility]   [facility] Room Number: [phone] Place of Service:  SNF [phone] Provider:  Lorenso QuarryShannon Luciano Cinquemani, NP-C  Lauro RegulusAnderson, Marshall W, MD  Patient Care Team: Lauro RegulusAnderson, Marshall W, MD as PCP - General (Internal Medicine)  Extended Emergency Contact Information Primary Emergency Contact: Knierim,[name] Address: [address]          [place], [place] [id number] [place] Home Phone: [phone] Mobile Phone: [phone] Relation: Spouse Secondary Emergency Contact: Lelon PerlaBrooks,Karen  [place] Home Phone: [phone] Work Phone: [phone] Mobile Phone: [phone] Relation: Niece  Code Status:  Full  Goals of care: Advanced Directive information Advanced Directives 04/16/2017  Does Patient Have a Medical Advance Directive? No  Type of Advance Directive -  Does patient want to make changes to medical advance directive? -  Copy of Healthcare Power of Attorney in Chart? -     Chief Complaint  Patient presents with  . Medical Management of Chronic Issues    Routine Visit    HPI:  [name] seen today for medical management of chronic diseases. Pt has been stable over the past month. Medication adjustments have been made for the anxiety and mood disorder. No falls. Pt continues to have intermittent crying episodes. She thinks her husband is leaving her and never coming back. But, this has improved. Her memory is worsening, she cannot remember her husbands visits 10 minutes after he leaves. She realizes that she has poor short term memory, causing worsening distress. Pt does enjoy the company of others. She enjoys working puzzles. She performs ADLs independently. Continent of B&B. Feeds self. Complains of early morning pain in the hands that diminishes with activity. Appetite is good. Regular BMs. Pt is looking more settled and less anxious as of late. She is smiling more, also. VSS. No other complaints.       Past  Medical History:  Diagnosis Date  . Allergy   . Anxiety   . Arthritis   . Asthma   . Baker's cyst    R knee  . Chest pain, unspecified 07/28/2013  . Coronary artery disease 07/28/2013  . Coronary heart disease   . Dementia   . Diverticulosis   . Encounter for Medicare annual wellness exam 09/29/2013  . Fibromyalgia   . Hematuria   . History of echocardiogram    EF > 55%, inf wall motion abnormalities, post MI  . Hyperlipidemia, unspecified    LDL 105 - Atorvostatin initiated 8/13, target LDL < 70  . Hypertension   . IBS (irritable bowel syndrome)   . Kidney stones   . Living will, counseling/discussion   . Mild dementia   . Myocardial infarct (HCC)    ST elevation MI  . Nephrolithiasis    [name]  . Osteoporosis   . Stented coronary artery    RCA 05/10/12, x2 DES mid and prox RCA  . UTI (lower urinary tract infection)    Past Surgical History:  Procedure Laterality Date  . ANGIOPLASTY    . APPENDECTOMY    . BREAST BIOPSY     benign nodule  . BREAST SURGERY     bxs x 5  . CARDIAC CATHETERIZATION  05/09/2012   [facility]  . CHOLECYSTECTOMY    . COLONOSCOPY  08/12/2008   Pt declined repeat in 2014  . CORONARY ANGIOPLASTY WITH STENT PLACEMENT    . CYSTOSCOPY    . LITHOTRIPSY    . TONSILLECTOMY    .  TOTAL ABDOMINAL HYSTERECTOMY W/ BILATERAL SALPINGOOPHORECTOMY    . URETEROSCOPY     with stent placement    Allergies  Allergen Reactions  . Epinephrine Other (See Comments)    Rapid heart beat.  . Dtap-Hepatitis B Recomb-Ipv Other (See Comments)  . Levofloxacin     Edema in knee  . Metronidazole     Nausea and edema in knee  . Other     Other reaction(s): Unknown Sympathomimetic agents   . Penicillins   . Tetanus Toxoids     Arm swelling  . Amoxicillin-Pot Clavulanate Rash    Allergies as of 04/16/2017      Reactions   Epinephrine Other (See Comments)   Rapid heart beat.   Dtap-hepatitis B Recomb-ipv Other (See Comments)   Levofloxacin    Edema in knee    Metronidazole    Nausea and edema in knee   Other    Other reaction(s): Unknown Sympathomimetic agents   Penicillins    Tetanus Toxoids    Arm swelling   Amoxicillin-pot Clavulanate Rash      Medication List       Accurate as of 04/16/17 11:23 AM. Always use your most recent med list.          acetaminophen 325 MG tablet Commonly known as:  TYLENOL Take 650 mg by mouth 2 (two) times daily.   aspirin 81 MG chewable tablet Chew 81 mg by mouth daily. With meal   cyanocobalamin 1000 MCG tablet Take 1,000 mcg by mouth daily.   divalproex 125 MG capsule Commonly known as:  DEPAKOTE SPRINKLE Take 375 mg by mouth 2 (two) times daily. 3 caps   escitalopram 10 MG tablet Commonly known as:  LEXAPRO Take 10 mg by mouth daily.   galantamine 8 MG tablet Commonly known as:  RAZADYNE Take 8 mg by mouth 2 (two) times daily.   memantine 14 MG Cp24 24 hr capsule Commonly known as:  NAMENDA XR Take 14 mg by mouth daily. Take with meals   NITROSTAT 0.4 MG SL tablet Generic drug:  nitroGLYCERIN Place 1 tablet under the tongue every 5 (five) minutes as needed for chest pain.   traZODone 50 MG tablet Commonly known as:  DESYREL Take 25 mg by mouth 2 (two) times daily. 1/2 tab   Vitamin D3 2000 units capsule Take 2,000 Units by mouth daily.       Review of Systems  Constitutional: Negative for activity change, appetite change, chills, diaphoresis and fever.  HENT: Negative for congestion, sneezing, sore throat, trouble swallowing and voice change.   Respiratory: Negative for apnea, cough, choking, chest tightness, shortness of breath and wheezing.   Cardiovascular: Negative for chest pain, palpitations and leg swelling.  Gastrointestinal: Negative for abdominal distention, abdominal pain, constipation, diarrhea and nausea.  Genitourinary: Negative for difficulty urinating, dysuria, frequency and urgency.  Musculoskeletal: Positive for arthralgias (typical arthritis). Negative  for back pain, gait problem and myalgias.  Skin: Negative for color change, pallor, rash and wound.  Neurological: Negative for dizziness, tremors, syncope, speech difficulty, weakness, numbness and headaches.  Psychiatric/Behavioral: Positive for confusion. Negative for agitation and behavioral problems.  All other systems reviewed and are negative.   Immunization History  Administered Date(s) Administered  . Influenza Whole 07/10/2013  . PPD Test 10/14/2016  . Pneumococcal Polysaccharide-23 08/31/2006   Pertinent  Health Maintenance Due  Topic Date Due  . DEXA SCAN  06/06/1998  . PNA vac Low Risk Adult (1 of 2 - PCV13) 06/06/1998  .  INFLUENZA VACCINE  04/11/2017   No flowsheet data found. Functional Status Survey:    Vitals:   04/16/17 1026  BP: 129/62  Pulse: 73  Resp: 18  Temp: 99.1 F (37.3 C)  SpO2: 93%  Weight: 156 lb 8 oz (71 kg)  Height: 5\' 3"  (1.6 m)   Body mass index is 27.72 kg/m. Physical Exam  Constitutional: She is oriented to person, place, and time. Vital signs are normal. She appears well-developed and well-nourished. She is active and cooperative. She does not appear ill. No distress.  HENT:  Head: Normocephalic and atraumatic.  Mouth/Throat: Uvula is midline, oropharynx is clear and moist and mucous membranes are normal. Mucous membranes are not pale, not dry and not cyanotic.  Eyes: Pupils are equal, round, and reactive to light. Conjunctivae, EOM and lids are normal.  Neck: Trachea normal, normal range of motion and full passive range of motion without pain. Neck supple. No JVD present. No tracheal deviation, no edema and no erythema present. No thyromegaly present.  Cardiovascular: Normal rate, regular rhythm, normal heart sounds, intact distal pulses and normal pulses.  Exam reveals no gallop, no distant heart sounds and no friction rub.   No murmur heard. Pulses:      Dorsalis pedis pulses are 2+ on the right side, and 2+ on the left side.  BLE  mild non-pitting edema. Tender on palpation  Pulmonary/Chest: Effort normal and breath sounds normal. No accessory muscle usage. No respiratory distress. She has no decreased breath sounds. She has no wheezes. She has no rhonchi. She has no rales. She exhibits no tenderness.  Abdominal: Normal appearance and bowel sounds are normal. She exhibits no distension and no ascites. There is no tenderness.  Musculoskeletal: Normal range of motion. She exhibits no edema or tenderness.  Expected osteoarthritis, stiffness  Neurological: She is alert and oriented to person, place, and time. She has normal strength.  Skin: Skin is warm, dry and intact. No rash noted. She is not diaphoretic. No cyanosis or erythema. No pallor. Nails show no clubbing.  Psychiatric: Her speech is normal and behavior is normal. Judgment and thought content normal. Her mood appears anxious. Cognition and memory are impaired. She exhibits abnormal recent memory.  Nursing note and vitals reviewed.   Labs reviewed:  Recent Labs  10/17/16 0400  NA 142  K 4.3  CL 105  CO2 28  GLUCOSE 77  BUN 13  CREATININE 0.77  CALCIUM 8.7*  MG 2.5*    Recent Labs  10/17/16 0400  AST 26  ALT 17  ALKPHOS 71  BILITOT 0.8  PROT 6.4*  ALBUMIN 3.4*    Recent Labs  10/17/16 0400  WBC 4.7  NEUTROABS 2.6  HGB 12.6  HCT 36.7  MCV 86.0  PLT 175   Lab Results  Component Value Date   TSH 1.180 10/17/2016   No results found for: HGBA1C Lab Results  Component Value Date   CHOL 119 10/17/2016   HDL 43 10/17/2016   LDLCALC 55 10/17/2016   TRIG 103 10/17/2016   CHOLHDL 2.8 10/17/2016    Significant Diagnostic Results in last 30 days:  No results found.  Assessment/Plan 1. Late onset Alzheimer's disease with behavioral disturbance  Stable  Continue Namenda XR to 14 mg po Q Day  Continue galantamin 8 mg po BID  Continue Depakote Sprinkles 375 mg po BID for mood stabilization  2. Fibromyalgia  Stable   3.  Atherosclerosis of native coronary artery of native heart with angina  pectoris (HCC)  Stable  Continue ASA 81 mg po Q Day  Continue Nitroglycerin 0.4 mg tablet SL Q 5 minutes x 3 prn for angina  4. Old myocardial infarction  Stable   5. Major depressive disorder, recurrent, in remission, unspecified (HCC)  Stable  Continue Lexapro 10 mg po Q Day  6. Anxiety disorder, unspecified type  Variable  Continue Trazodone 25 mg po BID  Continue Namenda XR to 14 mg po Q Day  Depakote sprinkles 375 mg po BID for mood stabilization  7. Hyperlipidemia, unspecified hyperlipidemia type  Stable/ resolved  8. Osteopenia, unspecified location  Stable  Continue Cholecalciferol 2,000 units po Q Day  9. Vitamin B12 deficiency  Stable  Continue Cyanocobalamin 1,000 mcg po Q Day  10. Osteoarthritis, unspecified  Stable   Continue Tylenol 650 mg po BID  Family/ staff Communication:   Total Time:  Documentation:  Face to Face:  Family/Phone:   Labs/tests ordered:    Medication list reviewed and assessed for continued appropriateness. Monthly medication orders reviewed and signed.  Brynda Rim, NP-C Geriatrics [facility] [phone] [address], [place] [address] Cell Phone (Mon-Fri 8am-5pm):  [phone] On Call:  [phone] & follow prompts after 5pm & weekends Office Phone:  [phone] Office Fax:  [phone]

## 2017-05-12 ENCOUNTER — Encounter
Admission: RE | Admit: 2017-05-12 | Discharge: 2017-05-12 | Disposition: A | Discharge status: Home / Self Care | Payer: Private Health Insurance - Indemnity | Source: Ambulatory Visit | Attending: Internal Medicine | Admitting: Internal Medicine

## 2017-05-17 ENCOUNTER — Non-Acute Institutional Stay: Payer: Medicare Other | Admitting: Gerontology

## 2017-05-17 ENCOUNTER — Encounter: Payer: Self-pay | Admitting: Gerontology

## 2017-05-17 DIAGNOSIS — M1990 Unspecified osteoarthritis, unspecified site: Secondary | ICD-10-CM | POA: Diagnosis not present

## 2017-05-17 DIAGNOSIS — I252 Old myocardial infarction: Secondary | ICD-10-CM

## 2017-05-17 DIAGNOSIS — M8580 Other specified disorders of bone density and structure, unspecified site: Secondary | ICD-10-CM

## 2017-05-17 DIAGNOSIS — I25119 Atherosclerotic heart disease of native coronary artery with unspecified angina pectoris: Secondary | ICD-10-CM

## 2017-05-17 DIAGNOSIS — F3340 Major depressive disorder, recurrent, in remission, unspecified: Secondary | ICD-10-CM

## 2017-05-17 DIAGNOSIS — E785 Hyperlipidemia, unspecified: Secondary | ICD-10-CM

## 2017-05-17 DIAGNOSIS — M797 Fibromyalgia: Secondary | ICD-10-CM | POA: Diagnosis not present

## 2017-05-17 DIAGNOSIS — F419 Anxiety disorder, unspecified: Secondary | ICD-10-CM

## 2017-05-17 DIAGNOSIS — G301 Alzheimer's disease with late onset: Secondary | ICD-10-CM | POA: Diagnosis not present

## 2017-05-17 DIAGNOSIS — F0281 Dementia in other diseases classified elsewhere with behavioral disturbance: Secondary | ICD-10-CM | POA: Diagnosis not present

## 2017-05-17 DIAGNOSIS — E538 Deficiency of other specified B group vitamins: Secondary | ICD-10-CM | POA: Diagnosis not present

## 2017-05-17 DIAGNOSIS — F02818 Dementia in other diseases classified elsewhere, unspecified severity, with other behavioral disturbance: Secondary | ICD-10-CM

## 2017-05-17 NOTE — Progress Notes (Signed)
[facility]   [facility] Room Number: [place] of Service:  ALF [phone] Provider:  Toni Arthurs, NP-C  Kirk Ruths, MD  Patient Care Team: Kirk Ruths, MD as PCP - General (Internal Medicine)  Extended Emergency Contact Information Primary Emergency Contact: Bastin,[name] Address: [address] [place], [place] [address] [place] of [place] Phone: [phone] Mobile Phone: [phone] Relation: Spouse Secondary Emergency Contact: Jonah [place] Phone: [phone] Work Phone: [phone] Mobile Phone: [phone] Relation: Niece  Code Status:  Full  Goals of care: Advanced Directive information Advanced Directives 05/17/2017  Does Patient Have a Medical Advance Directive? No  Type of Advance Directive -  Does patient want to make changes to medical advance directive? -  Copy of [place] in Chart? -     Chief Complaint  Patient presents with  . Medical Management of Chronic Issues    Routine Visit    HPI:  [name] seen today for medical management of chronic diseases. Pt has been stable over the past month. Medication adjustments have been made for the anxiety and mood disorder. No falls. Pt continues to have intermittent crying episodes. She thinks her husband is leaving her and never coming back. But, this has improved. Her memory is worsening, she cannot remember her husbands visits 10 minutes after he leaves. She realizes that she has poor short term memory, causing worsening distress. Pt does enjoy the company of others. She enjoys working puzzles. She performs ADLs independently. Continent of B&B. Feeds self. Complains of early morning pain in the hands that diminishes with activity. Appetite is good. Regular BMs. Pt is looking more settled and less anxious as of late. She is smiling more, also. VSS. No other complaints.       Past  Medical History:  Diagnosis Date  . Allergy   . Anxiety   . Arthritis   . Asthma   . Baker's cyst    R knee  . Chest pain, unspecified 07/28/2013  . Coronary artery disease 07/28/2013  . Coronary heart disease   . Dementia   . Diverticulosis   . Encounter for Medicare annual wellness exam 09/29/2013  . Fibromyalgia   . Hematuria   . History of echocardiogram    EF > 55%, inf wall motion abnormalities, post MI  . Hyperlipidemia, unspecified    LDL 105 - Atorvostatin initiated 8/13, target LDL < 70  . Hypertension   . IBS (irritable bowel syndrome)   . Kidney stones   . Living will, counseling/discussion   . Mild dementia   . Myocardial infarct (HCC)    ST elevation MI  . Nephrolithiasis    [name]  . Osteoporosis   . Stented coronary artery    RCA 05/10/12, x2 DES mid and prox RCA  . UTI (lower urinary tract infection)    Past Surgical History:  Procedure Laterality Date  . ANGIOPLASTY    . APPENDECTOMY    . BREAST BIOPSY     benign nodule  . BREAST SURGERY     bxs x 5  . CARDIAC CATHETERIZATION  05/09/2012   [facility]  . CHOLECYSTECTOMY    . COLONOSCOPY  08/12/2008   Pt declined repeat in 2014  . CORONARY ANGIOPLASTY WITH STENT PLACEMENT    . CYSTOSCOPY    . LITHOTRIPSY    . TONSILLECTOMY    .  TOTAL ABDOMINAL HYSTERECTOMY W/ BILATERAL SALPINGOOPHORECTOMY    . URETEROSCOPY     with stent placement    Allergies  Allergen Reactions  . Epinephrine Other (See Comments)    Rapid heart beat.  . Dtap-Hepatitis B Recomb-Ipv Other (See Comments)  . Levofloxacin     Edema in knee  . Metronidazole     Nausea and edema in knee  . Other     Other reaction(s): Unknown Sympathomimetic agents   . Penicillins   . Tetanus Toxoids     Arm swelling  . Amoxicillin-Pot Clavulanate Rash    Allergies as of 05/17/2017      Reactions   Epinephrine Other (See Comments)   Rapid heart beat.   Dtap-hepatitis B Recomb-ipv Other (See Comments)   Levofloxacin    Edema in knee    Metronidazole    Nausea and edema in knee   Other    Other reaction(s): Unknown Sympathomimetic agents   Penicillins    Tetanus Toxoids    Arm swelling   Amoxicillin-pot Clavulanate Rash      Medication List       Accurate as of 05/17/17  9:33 AM. Always use your most recent med list.          acetaminophen 325 MG tablet Commonly known as:  TYLENOL Take 650 mg by mouth 2 (two) times daily.   aspirin 81 MG chewable tablet Chew 81 mg by mouth daily. With meal   cyanocobalamin 1000 MCG tablet Take 1,000 mcg by mouth daily.   divalproex 125 MG capsule Commonly known as:  DEPAKOTE SPRINKLE Take 375 mg by mouth 2 (two) times daily. 3 caps   escitalopram 10 MG tablet Commonly known as:  LEXAPRO Take 10 mg by mouth daily.   galantamine 8 MG tablet Commonly known as:  RAZADYNE Take 8 mg by mouth 2 (two) times daily.   memantine 14 MG Cp24 24 hr capsule Commonly known as:  NAMENDA XR Take 14 mg by mouth daily. Take with meals   NITROSTAT 0.4 MG SL tablet Generic drug:  nitroGLYCERIN Place 1 tablet under the tongue every 5 (five) minutes as needed for chest pain.   traZODone 50 MG tablet Commonly known as:  DESYREL Take 25 mg by mouth 2 (two) times daily. 1/2 tab   Vitamin D3 2000 units capsule Take 2,000 Units by mouth daily.       Review of Systems  Constitutional: Negative for activity change, appetite change, chills, diaphoresis and fever.  HENT: Negative for congestion, sneezing, sore throat, trouble swallowing and voice change.   Respiratory: Negative for apnea, cough, choking, chest tightness, shortness of breath and wheezing.   Cardiovascular: Negative for chest pain, palpitations and leg swelling.  Gastrointestinal: Negative for abdominal distention, abdominal pain, constipation, diarrhea and nausea.  Genitourinary: Negative for difficulty urinating, dysuria, frequency and urgency.  Musculoskeletal: Positive for arthralgias (typical arthritis). Negative  for back pain, gait problem and myalgias.  Skin: Negative for color change, pallor, rash and wound.  Neurological: Negative for dizziness, tremors, syncope, speech difficulty, weakness, numbness and headaches.  Psychiatric/Behavioral: Positive for confusion. Negative for agitation and behavioral problems.  All other systems reviewed and are negative.   Immunization History  Administered Date(s) Administered  . Influenza Whole 07/10/2013  . PPD Test 10/14/2016  . Pneumococcal Polysaccharide-23 08/31/2006   Pertinent  Health Maintenance Due  Topic Date Due  . DEXA SCAN  06/06/1998  . PNA vac Low Risk Adult (2 of 2 - PCV13) 09/01/2007  .  INFLUENZA VACCINE  04/11/2017   No flowsheet data found. Functional Status Survey:    Vitals:   05/17/17 0922  BP: 129/62  Pulse: 73  Resp: 18  Temp: 99.1 F (37.3 C)  SpO2: 93%  Weight: 160 lb 11.5 oz (72.9 kg)  Height: '5\' 3"'  (1.6 m)   Body mass index is 28.47 kg/m. Physical Exam  Constitutional: She is oriented to person, place, and time. Vital signs are normal. She appears well-developed and well-nourished. She is active and cooperative. She does not appear ill. No distress.  HENT:  Head: Normocephalic and atraumatic.  Mouth/Throat: Uvula is midline, oropharynx is clear and moist and mucous membranes are normal. Mucous membranes are not pale, not dry and not cyanotic.  Eyes: Pupils are equal, round, and reactive to light. Conjunctivae, EOM and lids are normal.  Neck: Trachea normal, normal range of motion and full passive range of motion without pain. Neck supple. No JVD present. No tracheal deviation, no edema and no erythema present. No thyromegaly present.  Cardiovascular: Normal rate, regular rhythm, normal heart sounds, intact distal pulses and normal pulses.  Exam reveals no gallop, no distant heart sounds and no friction rub.   No murmur heard. Pulses:      Dorsalis pedis pulses are 2+ on the right side, and 2+ on the left side.    BLE mild non-pitting edema. Tender on palpation  Pulmonary/Chest: Effort normal and breath sounds normal. No accessory muscle usage. No respiratory distress. She has no decreased breath sounds. She has no wheezes. She has no rhonchi. She has no rales. She exhibits no tenderness.  Abdominal: Normal appearance and bowel sounds are normal. She exhibits no distension and no ascites. There is no tenderness.  Musculoskeletal: Normal range of motion. She exhibits no edema or tenderness.  Expected osteoarthritis, stiffness  Neurological: She is alert and oriented to person, place, and time. She has normal strength.  Skin: Skin is warm, dry and intact. No rash noted. She is not diaphoretic. No cyanosis or erythema. No pallor. Nails show no clubbing.  Psychiatric: Her speech is normal and behavior is normal. Judgment and thought content normal. Her mood appears anxious. Cognition and memory are impaired. She exhibits abnormal recent memory.  Nursing note and vitals reviewed.   Labs reviewed:  Recent Labs  10/17/16 0400  NA 142  K 4.3  CL 105  CO2 28  GLUCOSE 77  BUN 13  CREATININE 0.77  CALCIUM 8.7*  MG 2.5*    Recent Labs  10/17/16 0400  AST 26  ALT 17  ALKPHOS 71  BILITOT 0.8  PROT 6.4*  ALBUMIN 3.4*    Recent Labs  10/17/16 0400  WBC 4.7  NEUTROABS 2.6  HGB 12.6  HCT 36.7  MCV 86.0  PLT 175   Lab Results  Component Value Date   TSH 1.180 10/17/2016   No results found for: HGBA1C Lab Results  Component Value Date   CHOL 119 10/17/2016   HDL 43 10/17/2016   LDLCALC 55 10/17/2016   TRIG 103 10/17/2016   CHOLHDL 2.8 10/17/2016    Significant Diagnostic Results in last 30 days:  No results found.  Assessment/Plan 1. Late onset Alzheimer's disease with behavioral disturbance  Stable  Continue Namenda XR to 14 mg po Q Day  Continue galantamin 8 mg po BID  Continue Depakote Sprinkles 375 mg po BID for mood stabilization  2. Fibromyalgia  Stable   3.  Atherosclerosis of native coronary artery of native heart with  angina pectoris (HCC)  Stable  Continue ASA 81 mg po Q Day  Continue Nitroglycerin 0.4 mg tablet SL Q 5 minutes x 3 prn for angina  4. Old myocardial infarction  Stable   5. Major depressive disorder, recurrent, in remission, unspecified (HCC)  Stable  Continue Lexapro 10 mg po Q Day  6. Anxiety disorder, unspecified type  Variable  Continue Trazodone 25 mg po BID  Continue Namenda XR to 14 mg po Q Day  Depakote sprinkles 375 mg po BID for mood stabilization  7. Hyperlipidemia, unspecified hyperlipidemia type  Stable/ resolved  8. Osteopenia, unspecified location  Stable  Continue Cholecalciferol 2,000 units po Q Day  9. Vitamin B12 deficiency  Stable  Continue Cyanocobalamin 1,000 mcg po Q Day  10. Osteoarthritis, unspecified  Stable   Continue Tylenol 650 mg po BID  Family/ staff Communication:   Total Time:  Documentation:  Face to Face:  Family/Phone:   Labs/tests ordered:  Cbc, met c Mag+, B12, @, lipid panel, free depakote level  Medication list reviewed and assessed for continued appropriateness. Monthly medication orders reviewed and signed.  Vikki Ports, NP-C Geriatrics [facility] [phone] [place], [place] [address] Cell Phone (Mon-Fri 8am-5pm):  [phone] On Call:  [phone] & follow prompts after 5pm & weekends Office Phone:  [phone] Office Fax:  [phone]

## 2017-05-31 ENCOUNTER — Other Ambulatory Visit
Admission: RE | Admit: 2017-05-31 | Discharge: 2017-05-31 | Disposition: A | Discharge status: Home / Self Care | Payer: Medicare Other | Source: Ambulatory Visit | Attending: Gerontology | Admitting: Gerontology

## 2017-05-31 DIAGNOSIS — I25119 Atherosclerotic heart disease of native coronary artery with unspecified angina pectoris: Secondary | ICD-10-CM | POA: Diagnosis present

## 2017-05-31 LAB — CBC WITH DIFFERENTIAL/PLATELET
BASOS ABS: 0 10*3/uL (ref 0–0.1)
BASOS PCT: 1 %
EOS ABS: 0.3 10*3/uL (ref 0–0.7)
Eosinophils Relative: 7 %
HEMATOCRIT: 37.5 % (ref 35.0–47.0)
HEMOGLOBIN: 12.8 g/dL (ref 12.0–16.0)
Lymphocytes Relative: 31 %
Lymphs Abs: 1.5 10*3/uL (ref 1.0–3.6)
MCH: 30.9 pg (ref 26.0–34.0)
MCHC: 34.2 g/dL (ref 32.0–36.0)
MCV: 90.3 fL (ref 80.0–100.0)
MONOS PCT: 11 %
Monocytes Absolute: 0.5 10*3/uL (ref 0.2–0.9)
NEUTROS ABS: 2.5 10*3/uL (ref 1.4–6.5)
NEUTROS PCT: 50 %
Platelets: 169 10*3/uL (ref 150–440)
RBC: 4.15 MIL/uL (ref 3.80–5.20)
RDW: 13.8 % (ref 11.5–14.5)
WBC: 5 10*3/uL (ref 3.6–11.0)

## 2017-05-31 LAB — COMPREHENSIVE METABOLIC PANEL
ALBUMIN: 3.5 g/dL (ref 3.5–5.0)
ALK PHOS: 47 U/L (ref 38–126)
ALT: 12 U/L — ABNORMAL LOW (ref 14–54)
ANION GAP: 7 (ref 5–15)
AST: 22 U/L (ref 15–41)
BILIRUBIN TOTAL: 0.5 mg/dL (ref 0.3–1.2)
BUN: 15 mg/dL (ref 6–20)
CALCIUM: 8.8 mg/dL — AB (ref 8.9–10.3)
CO2: 30 mmol/L (ref 22–32)
Chloride: 103 mmol/L (ref 101–111)
Creatinine, Ser: 0.76 mg/dL (ref 0.44–1.00)
GFR calc Af Amer: >60 mL/min (ref >60)
GFR calc non Af Amer: >60 mL/min (ref >60)
GLUCOSE: 80 mg/dL (ref 65–99)
Potassium: 4.3 mmol/L (ref 3.5–5.1)
SODIUM: 140 mmol/L (ref 135–145)
Total Protein: 6.5 g/dL (ref 6.5–8.1)

## 2017-05-31 LAB — TSH: TSH: 1.484 u[IU]/mL (ref 0.350–4.500)

## 2017-05-31 LAB — VITAMIN B12: VITAMIN B 12: 735 pg/mL (ref 180–914)

## 2017-05-31 LAB — MAGNESIUM: Magnesium: 2.5 mg/dL — ABNORMAL HIGH (ref 1.7–2.4)

## 2017-06-01 LAB — LIPID PANEL
Cholesterol: 208 mg/dL — ABNORMAL HIGH (ref 0–200)
HDL: 52 mg/dL (ref >40)
LDL CALC: 126 mg/dL — AB (ref 0–99)
Total CHOL/HDL Ratio: 4 RATIO
Triglycerides: 151 mg/dL — ABNORMAL HIGH (ref <150)
VLDL: 30 mg/dL (ref 0–40)

## 2017-06-01 LAB — VITAMIN D 25 HYDROXY (VIT D DEFICIENCY, FRACTURES): VIT D 25 HYDROXY: 57.4 ng/mL (ref 30.0–100.0)

## 2017-06-11 ENCOUNTER — Encounter
Admission: RE | Admit: 2017-06-11 | Discharge: 2017-06-11 | Disposition: A | Discharge status: Home / Self Care | Payer: Private Health Insurance - Indemnity | Source: Ambulatory Visit | Attending: Internal Medicine | Admitting: Internal Medicine

## 2017-06-14 ENCOUNTER — Encounter: Payer: Self-pay | Admitting: Gerontology

## 2017-06-14 ENCOUNTER — Non-Acute Institutional Stay: Payer: Medicare Other | Admitting: Gerontology

## 2017-06-14 DIAGNOSIS — E538 Deficiency of other specified B group vitamins: Secondary | ICD-10-CM | POA: Diagnosis not present

## 2017-06-14 DIAGNOSIS — E785 Hyperlipidemia, unspecified: Secondary | ICD-10-CM | POA: Diagnosis not present

## 2017-06-14 DIAGNOSIS — F3340 Major depressive disorder, recurrent, in remission, unspecified: Secondary | ICD-10-CM | POA: Diagnosis not present

## 2017-06-14 DIAGNOSIS — F419 Anxiety disorder, unspecified: Secondary | ICD-10-CM

## 2017-06-14 DIAGNOSIS — M8580 Other specified disorders of bone density and structure, unspecified site: Secondary | ICD-10-CM

## 2017-06-14 DIAGNOSIS — M797 Fibromyalgia: Secondary | ICD-10-CM

## 2017-06-14 DIAGNOSIS — I25119 Atherosclerotic heart disease of native coronary artery with unspecified angina pectoris: Secondary | ICD-10-CM

## 2017-06-14 DIAGNOSIS — M1990 Unspecified osteoarthritis, unspecified site: Secondary | ICD-10-CM

## 2017-06-14 DIAGNOSIS — I252 Old myocardial infarction: Secondary | ICD-10-CM

## 2017-06-14 DIAGNOSIS — G301 Alzheimer's disease with late onset: Secondary | ICD-10-CM | POA: Diagnosis not present

## 2017-06-14 DIAGNOSIS — F02818 Dementia in other diseases classified elsewhere, unspecified severity, with other behavioral disturbance: Secondary | ICD-10-CM

## 2017-06-14 DIAGNOSIS — F0281 Dementia in other diseases classified elsewhere with behavioral disturbance: Secondary | ICD-10-CM | POA: Diagnosis not present

## 2017-06-14 NOTE — Progress Notes (Signed)
[facility]:   [facility] Room Number: [phone] Place of Service:  ALF [phone]) Provider:  Lorenso Quarry, NP-C  [name], MD  Patient Care Team: [name], MD as PCP - General (Internal Medicine)  Extended Emergency Contact Information Primary Emergency Contact: Tweedy,[name] Address: [address]          [place], [place] [address] [place] Home Phone: [phone] Mobile Phone: [phone] Relation: Spouse Secondary Emergency Contact: Lelon Perla [place] Home Phone: [phone] Work Phone: [phone] Mobile Phone: [phone] Relation: Niece  Code Status:  Full  Goals of care: Advanced Directive information Advanced Directives 06/14/2017  Does Patient Have a Medical Advance Directive? No  Type of Advance Directive -  Does patient want to make changes to medical advance directive? -  Copy of Healthcare Power of Attorney in Chart? -     Chief Complaint  Patient presents with  . Medical Management of Chronic Issues    Routine Visit    HPI:  [name] seen today for medical management of chronic diseases.  Patient has been stable over the past month.  Medication adjustments have been made for the anxiety and mood disorder this appears to have been effective.  Patient has less crying episodes and appears to be happier and more content.  No falls this month.  Her memory is worsening, she cannot remember her husband's visits 10 minutes after he leaves.  She realizes that she has poor short-term memory, causing worsening distress.  Patient does enjoy the company of others.  She enjoys working puzzles.  She performs ADLs independently.  Continent of bowel and bladder.  Feeds self.  Complains of early morning pain in the hands that diminishes with activity.  Appetite is good.  Having regular BMs.  Vital signs stable.  No other complaints.      Past Medical History:  Diagnosis Date    . Allergy   . Anxiety   . Arthritis   . Asthma   . Baker's cyst    R knee  . Chest pain, unspecified 07/28/2013  . Coronary artery disease 07/28/2013  . Coronary heart disease   . Dementia   . Diverticulosis   . Encounter for Medicare annual wellness exam 09/29/2013  . Fibromyalgia   . Hematuria   . History of echocardiogram    EF > 55%, inf wall motion abnormalities, post MI  . Hyperlipidemia, unspecified    LDL 105 - Atorvostatin initiated 8/13, target LDL < 70  . Hypertension   . IBS (irritable bowel syndrome)   . Kidney stones   . Living will, counseling/discussion   . Mild dementia   . Myocardial infarct (HCC)    ST elevation MI  . Nephrolithiasis    [name]  . Osteoporosis   . Stented coronary artery    RCA 05/10/12, x2 DES mid and prox RCA  . UTI (lower urinary tract infection)    Past Surgical History:  Procedure Laterality Date  . ANGIOPLASTY    . APPENDECTOMY    . BREAST BIOPSY     benign nodule  . BREAST SURGERY     bxs x 5  . CARDIAC CATHETERIZATION  05/09/2012   [facility]  . CHOLECYSTECTOMY    . COLONOSCOPY  08/12/2008   Pt declined repeat in 2014  . CORONARY ANGIOPLASTY WITH STENT PLACEMENT    . CYSTOSCOPY    . LITHOTRIPSY    . TONSILLECTOMY    .  TOTAL ABDOMINAL HYSTERECTOMY W/ BILATERAL SALPINGOOPHORECTOMY    . URETEROSCOPY     with stent placement    Allergies  Allergen Reactions  . Epinephrine Other (See Comments)    Rapid heart beat.  . Dtap-Hepatitis B Recomb-Ipv Other (See Comments)  . Levofloxacin     Edema in knee  . Metronidazole     Nausea and edema in knee  . Other     Other reaction(s): Unknown Sympathomimetic agents   . Penicillins   . Tetanus Toxoids     Arm swelling  . Amoxicillin-Pot Clavulanate Rash    Allergies as of 06/14/2017      Reactions   Epinephrine Other (See Comments)   Rapid heart beat.   Dtap-hepatitis B Recomb-ipv Other (See Comments)   Levofloxacin    Edema in knee   Metronidazole    Nausea and  edema in knee   Other    Other reaction(s): Unknown Sympathomimetic agents   Penicillins    Tetanus Toxoids    Arm swelling   Amoxicillin-pot Clavulanate Rash      Medication List       Accurate as of 06/14/17 10:42 AM. Always use your most recent med list.          acetaminophen 325 MG tablet Commonly known as:  TYLENOL Take 650 mg by mouth 2 (two) times daily.   aspirin 81 MG chewable tablet Chew 81 mg by mouth daily. With meal   atorvastatin 10 MG tablet Commonly known as:  LIPITOR Take 10 mg by mouth daily.   cyanocobalamin 1000 MCG tablet Take 1,000 mcg by mouth daily.   divalproex 125 MG capsule Commonly known as:  DEPAKOTE SPRINKLE Take 375 mg by mouth 2 (two) times daily. 3 caps   escitalopram 10 MG tablet Commonly known as:  LEXAPRO Take 10 mg by mouth daily.   galantamine 8 MG tablet Commonly known as:  RAZADYNE Take 8 mg by mouth 2 (two) times daily.   memantine 14 MG Cp24 24 hr capsule Commonly known as:  NAMENDA XR Take 14 mg by mouth daily. Take with meals   NITROSTAT 0.4 MG SL tablet Generic drug:  nitroGLYCERIN Place 1 tablet under the tongue every 5 (five) minutes as needed for chest pain.   traZODone 50 MG tablet Commonly known as:  DESYREL Take 25 mg by mouth 2 (two) times daily. 1/2 tab   Vitamin D3 2000 units capsule Take 2,000 Units by mouth daily.       Review of Systems  Constitutional: Negative for activity change, appetite change, chills, diaphoresis and fever.  HENT: Negative for congestion, sneezing, sore throat, trouble swallowing and voice change.   Respiratory: Negative for apnea, cough, choking, chest tightness, shortness of breath and wheezing.   Cardiovascular: Negative for chest pain, palpitations and leg swelling.  Gastrointestinal: Negative for abdominal distention, abdominal pain, constipation, diarrhea and nausea.  Genitourinary: Negative for difficulty urinating, dysuria, frequency and urgency.    Musculoskeletal: Positive for arthralgias (typical arthritis). Negative for back pain, gait problem and myalgias.  Skin: Negative for color change, pallor, rash and wound.  Neurological: Negative for dizziness, tremors, syncope, speech difficulty, weakness, numbness and headaches.  Psychiatric/Behavioral: Positive for confusion. Negative for agitation and behavioral problems.  All other systems reviewed and are negative.   Immunization History  Administered Date(s) Administered  . Influenza Whole 07/10/2013  . Influenza-Unspecified 06/05/2017  . PPD Test 10/14/2016  . Pneumococcal Polysaccharide-23 08/31/2006   Pertinent  Health Maintenance Due  Topic  Date Due  . DEXA SCAN  06/06/1998  . PNA vac Low Risk Adult (2 of 2 - PCV13) 09/01/2007  . INFLUENZA VACCINE  Completed   No flowsheet data found. Functional Status Survey:    Vitals:   06/14/17 1039  BP: 129/62  Pulse: 73  Resp: 18  Temp: 99 F (37.2 C)  SpO2: 93%  Weight: 161 lb 6.4 oz (73.2 kg)  Height:  (1.6 m)   Body mass index is 28.59 kg/m. Physical Exam  Constitutional: She is oriented to person, place, and time. Vital signs are normal. She appears well-developed and well-nourished. She is active and cooperative. She does not appear ill. No distress.  HENT:  Head: Normocephalic and atraumatic.  Mouth/Throat: Uvula is midline, oropharynx is clear and moist and mucous membranes are normal. Mucous membranes are not pale, not dry and not cyanotic.  Eyes: Pupils are equal, round, and reactive to light. Conjunctivae, EOM and lids are normal.  Neck: Trachea normal, normal range of motion and full passive range of motion without pain. Neck supple. No JVD present. No tracheal deviation, no edema and no erythema present. No thyromegaly present.  Cardiovascular: Normal rate, regular rhythm, normal heart sounds, intact distal pulses and normal pulses.  Exam reveals no gallop, no distant heart sounds and no friction rub.    No murmur heard. Pulses:      Dorsalis pedis pulses are 2+ on the right side, and 2+ on the left side.  Minimal BLE edema  Pulmonary/Chest: Effort normal and breath sounds normal. No accessory muscle usage. No respiratory distress. She has no decreased breath sounds. She has no wheezes. She has no rhonchi. She has no rales. She exhibits no tenderness.  Abdominal: Soft. Normal appearance and bowel sounds are normal. She exhibits no distension and no ascites. There is no tenderness.  Musculoskeletal: Normal range of motion. She exhibits no edema or tenderness.  Expected osteoarthritis, stiffness  Neurological: She is alert and oriented to person, place, and time. She has normal strength.  Skin: Skin is warm, dry and intact. No rash noted. She is not diaphoretic. No cyanosis or erythema. No pallor. Nails show no clubbing.  Psychiatric: Her speech is normal and behavior is normal. Judgment and thought content normal. Her mood appears anxious. Cognition and memory are impaired. She exhibits abnormal recent memory.  Nursing note and vitals reviewed.   Labs reviewed:  Recent Labs  10/17/16 0400 05/31/17 0455  NA 142 140  K 4.3 4.3  CL 105 103  CO2 28 30  GLUCOSE 77 80  BUN 13 15  CREATININE 0.77 0.76  CALCIUM 8.7* 8.8*  MG 2.5* 2.5*    Recent Labs  10/17/16 0400 05/31/17 0455  AST 26 22  ALT 17 12*  ALKPHOS 71 47  BILITOT 0.8 0.5  PROT 6.4* 6.5  ALBUMIN 3.4* 3.5    Recent Labs  10/17/16 0400 05/31/17 0455  WBC 4.7 5.0  NEUTROABS 2.6 2.5  HGB 12.6 12.8  HCT 36.7 37.5  MCV 86.0 90.3  PLT 175 169   Lab Results  Component Value Date   TSH 1.484 05/31/2017   No results found for: HGBA1C Lab Results  Component Value Date   CHOL 208 (H) 05/31/2017   HDL 52 05/31/2017   LDLCALC 126 (H) 05/31/2017   TRIG 151 (H) 05/31/2017   CHOLHDL 4.0 05/31/2017    Significant Diagnostic Results in last 30 days:  No results found.  Assessment/Plan 1. Late onset Alzheimer's  disease with  behavioral disturbance  Stable  Continue Namenda XR to 14 mg po Q Day  Continue galantamin 8 mg po BID  Continue Depakote Sprinkles 375 mg po BID for mood stabilization  2. Fibromyalgia  Stable   3. Atherosclerosis of native coronary artery of native heart with angina pectoris (HCC)  Stable  Continue ASA 81 mg po Q Day  Continue Nitroglycerin 0.4 mg tablet SL Q 5 minutes x 3 prn for angina  4. Old myocardial infarction  Stable   5. Major depressive disorder, recurrent, in remission, unspecified (HCC)  Stable  Continue Lexapro 10 mg po Q Day  6. Anxiety disorder, unspecified type  Variable  Continue Trazodone 25 mg po BID  Continue Namenda XR to 14 mg po Q Day  Depakote sprinkles 375 mg po BID for mood stabilization  7. Hyperlipidemia, unspecified hyperlipidemia type  Stable/ resolved  8. Osteopenia, unspecified location  Stable  Continue Cholecalciferol 2,000 units po Q Day  9. Vitamin B12 deficiency  Stable  Continue Cyanocobalamin 1,000 mcg po Q Day  10. Osteoarthritis, unspecified  Stable   Continue Tylenol 650 mg po BID  Family/ staff Communication:   Total Time:  Documentation:  Face to Face:  Family/Phone:   Labs/tests ordered: Not due  Medication list reviewed and assessed for continued appropriateness. Monthly medication orders reviewed and signed.  Brynda Rim, NP-C Geriatrics [facility] [phone] [address], [place] [address] Cell Phone (Mon-Fri 8am-5pm):  [phone] On Call:  [phone] & follow prompts after 5pm & weekends Office Phone:  [phone] Office Fax:  [phone]

## 2017-07-12 ENCOUNTER — Encounter
Admission: RE | Admit: 2017-07-12 | Discharge: 2017-07-12 | Disposition: A | Discharge status: Home / Self Care | Payer: Private Health Insurance - Indemnity | Source: Ambulatory Visit | Attending: Internal Medicine | Admitting: Internal Medicine

## 2017-07-13 ENCOUNTER — Non-Acute Institutional Stay: Payer: Medicare Other | Admitting: Gerontology

## 2017-07-13 ENCOUNTER — Encounter: Payer: Self-pay | Admitting: Gerontology

## 2017-07-13 DIAGNOSIS — R609 Edema, unspecified: Secondary | ICD-10-CM

## 2017-07-13 NOTE — Progress Notes (Signed)
Location:   [facility] Room Number: [phone] Place of Service:  ALF [phone] Provider:  Lorenso QuarryShannon Astha Probasco, NP-C  Lauro RegulusAnderson, Marshall W, MD  Patient Care Team: Lauro RegulusAnderson, Marshall W, MD as PCP - General (Internal Medicine)  Extended Emergency Contact Information Primary Emergency Contact: Puccio,[name] Address: [address]          [place], [place] [id number] [place] Home Phone: [phone] Mobile Phone: [phone] Relation: Spouse Secondary Emergency Contact: Lelon PerlaBrooks,Karen  [place] Home Phone: [phone] Work Phone: [phone] Mobile Phone: [phone] Relation: Niece  Code Status:  FULL Goals of care: Advanced Directive information Advanced Directives 07/13/2017  Does Patient Have a Medical Advance Directive? No  Type of Advance Directive -  Does patient want to make changes to medical advance directive? -  Copy of Healthcare Power of Attorney in Chart? -     Chief Complaint  Patient presents with  . Acute Visit    Recheck leg edema    HPI:  [name] seen today for an acute visit for BLE edema.  Patient was complaining of right lower extremity edema with pain and redness.  Patient had increased discomfort when attempting to bear weight on the leg.  Gave verbal order over the weekend to obtain a venous Doppler of the leg.  Which was negative.  Today on assessment, patient reports the edema is much better.  She reports she has been elevating her legs more when at rest.  Bilateral lower extremity with minimal edema.  Patient denies chest pain, cough, shortness of breath.  Vital signs stable.  No other complaints.   Past Medical History:  Diagnosis Date  . Allergy   . Anxiety   . Arthritis   . Asthma   . Baker's cyst    R knee  . Chest pain, unspecified 07/28/2013  . Coronary artery disease 07/28/2013  . Coronary heart disease   . Dementia   . Diverticulosis   . Encounter for Medicare  annual wellness exam 09/29/2013  . Fibromyalgia   . Hematuria   . History of echocardiogram    EF > 55%, inf wall motion abnormalities, post MI  . Hyperlipidemia, unspecified    LDL 105 - Atorvostatin initiated 8/13, target LDL < 70  . Hypertension   . IBS (irritable bowel syndrome)   . Kidney stones   . Living will, counseling/discussion   . Mild dementia   . Myocardial infarct (HCC)    ST elevation MI  . Nephrolithiasis    [name]  . Osteoporosis   . Stented coronary artery    RCA 05/10/12, x2 DES mid and prox RCA  . UTI (lower urinary tract infection)    Past Surgical History:  Procedure Laterality Date  . ANGIOPLASTY    . APPENDECTOMY    . BREAST BIOPSY     benign nodule  . BREAST SURGERY     bxs x 5  . CARDIAC CATHETERIZATION  05/09/2012   [facility]  . CHOLECYSTECTOMY    . COLONOSCOPY  08/12/2008   Pt declined repeat in 2014  . CORONARY ANGIOPLASTY WITH STENT PLACEMENT    . CYSTOSCOPY    . LITHOTRIPSY    . TONSILLECTOMY    . TOTAL ABDOMINAL HYSTERECTOMY W/ BILATERAL SALPINGOOPHORECTOMY    . URETEROSCOPY     with stent placement    Allergies  Allergen Reactions  . Epinephrine Other (See Comments)    Rapid heart beat.  .Marland Kitchen  Dtap-Hepatitis B Recomb-Ipv Other (See Comments)  . Levofloxacin     Edema in knee  . Metronidazole     Nausea and edema in knee  . Other     Other reaction(s): Unknown Sympathomimetic agents   . Penicillins   . Tetanus Toxoids     Arm swelling  . Amoxicillin-Pot Clavulanate Rash    Allergies as of 07/13/2017      Reactions   Epinephrine Other (See Comments)   Rapid heart beat.   Dtap-hepatitis B Recomb-ipv Other (See Comments)   Levofloxacin    Edema in knee   Metronidazole    Nausea and edema in knee   Other    Other reaction(s): Unknown Sympathomimetic agents   Penicillins    Tetanus Toxoids    Arm swelling   Amoxicillin-pot Clavulanate Rash      Medication List       Accurate as of 07/13/17  4:07 PM. Always use your  most recent med list.          acetaminophen 325 MG tablet Commonly known as:  TYLENOL Take 650 mg by mouth 2 (two) times daily.   aspirin 81 MG chewable tablet Chew 81 mg by mouth daily. With meal   atorvastatin 10 MG tablet Commonly known as:  LIPITOR Take 10 mg by mouth daily.   cyanocobalamin 1000 MCG tablet Take 1,000 mcg by mouth daily.   divalproex 125 MG capsule Commonly known as:  DEPAKOTE SPRINKLE Take 375 mg by mouth 2 (two) times daily. 3 caps   escitalopram 10 MG tablet Commonly known as:  LEXAPRO Take 10 mg by mouth daily.   galantamine 8 MG tablet Commonly known as:  RAZADYNE Take 8 mg by mouth 2 (two) times daily.   memantine 14 MG Cp24 24 hr capsule Commonly known as:  NAMENDA XR Take 14 mg by mouth daily. Take with meals   NITROSTAT 0.4 MG SL tablet Generic drug:  nitroGLYCERIN Place 1 tablet under the tongue every 5 (five) minutes as needed for chest pain.   traZODone 50 MG tablet Commonly known as:  DESYREL Take 25 mg by mouth 2 (two) times daily. 1/2 tab   Vitamin D3 2000 units capsule Take 2,000 Units by mouth daily.       Review of Systems  Constitutional: Negative for activity change, appetite change, chills, diaphoresis and fever.  HENT: Negative for congestion, sneezing, sore throat, trouble swallowing and voice change.   Respiratory: Negative for apnea, cough, choking, chest tightness, shortness of breath and wheezing.   Cardiovascular: Negative for chest pain, palpitations and leg swelling.  Gastrointestinal: Negative for abdominal distention, abdominal pain, constipation, diarrhea and nausea.  Genitourinary: Negative for difficulty urinating, dysuria, frequency and urgency.  Musculoskeletal: Positive for arthralgias (typical arthritis). Negative for back pain, gait problem and myalgias.  Skin: Negative for color change, pallor, rash and wound.  Neurological: Negative for dizziness, tremors, syncope, speech difficulty, weakness,  numbness and headaches.  Psychiatric/Behavioral: Negative for agitation and behavioral problems.  All other systems reviewed and are negative.   Immunization History  Administered Date(s) Administered  . Influenza Whole 07/10/2013  . Influenza-Unspecified 06/05/2017  . PPD Test 10/14/2016  . Pneumococcal Polysaccharide-23 08/31/2006   Pertinent  Health Maintenance Due  Topic Date Due  . DEXA SCAN  06/06/1998  . PNA vac Low Risk Adult (2 of 2 - PCV13) 09/01/2007  . INFLUENZA VACCINE  Completed   No flowsheet data found. Functional Status Survey:    Vitals:  07/13/17 1604  BP: 130/66  Pulse: 70  Resp: 16  Temp: 99 F (37.2 C)  SpO2: 96%  Weight: 161 lb 9.6 oz (73.3 kg)  Height: 5\' 3"  (1.6 m)   Body mass index is 28.63 kg/m. Physical Exam  Constitutional: She is oriented to person, place, and time. Vital signs are normal. She appears well-developed and well-nourished. She is active and cooperative. She does not appear ill. No distress.  HENT:  Head: Normocephalic and atraumatic.  Mouth/Throat: Uvula is midline, oropharynx is clear and moist and mucous membranes are normal. Mucous membranes are not pale, not dry and not cyanotic.  Eyes: Conjunctivae, EOM and lids are normal. Pupils are equal, round, and reactive to light.  Neck: Trachea normal, normal range of motion and full passive range of motion without pain. Neck supple. No JVD present. No tracheal deviation, no edema and no erythema present. No thyromegaly present.  Cardiovascular: Normal rate, regular rhythm, normal heart sounds, intact distal pulses and normal pulses. Exam reveals no gallop, no distant heart sounds and no friction rub.  No murmur heard. Pulses:      Dorsalis pedis pulses are 2+ on the right side, and 2+ on the left side.  Minimal BLE edema  Pulmonary/Chest: Effort normal and breath sounds normal. No accessory muscle usage. No respiratory distress. She has no decreased breath sounds. She has no  wheezes. She has no rhonchi. She has no rales. She exhibits no tenderness.  Abdominal: Normal appearance and bowel sounds are normal. She exhibits no distension and no ascites. There is no tenderness.  Musculoskeletal: Normal range of motion. She exhibits no edema or tenderness.  Expected osteoarthritis, stiffness  Neurological: She is alert and oriented to person, place, and time. She has normal strength.  Skin: Skin is warm, dry and intact. She is not diaphoretic. No cyanosis. No pallor. Nails show no clubbing.  Psychiatric: Her speech is normal and behavior is normal. Thought content normal. Her mood appears anxious. Cognition and memory are impaired. She expresses impulsivity. She exhibits abnormal recent memory.  Nursing note and vitals reviewed.   Labs reviewed:  Recent Labs  10/17/16 0400 05/31/17 0455  NA 142 140  K 4.3 4.3  CL 105 103  CO2 28 30  GLUCOSE 77 80  BUN 13 15  CREATININE 0.77 0.76  CALCIUM 8.7* 8.8*  MG 2.5* 2.5*    Recent Labs  10/17/16 0400 05/31/17 0455  AST 26 22  ALT 17 12*  ALKPHOS 71 47  BILITOT 0.8 0.5  PROT 6.4* 6.5  ALBUMIN 3.4* 3.5    Recent Labs  10/17/16 0400 05/31/17 0455  WBC 4.7 5.0  NEUTROABS 2.6 2.5  HGB 12.6 12.8  HCT 36.7 37.5  MCV 86.0 90.3  PLT 175 169   Lab Results  Component Value Date   TSH 1.484 05/31/2017   No results found for: HGBA1C Lab Results  Component Value Date   CHOL 208 (H) 05/31/2017   HDL 52 05/31/2017   LDLCALC 126 (H) 05/31/2017   TRIG 151 (H) 05/31/2017   CHOLHDL 4.0 05/31/2017    Significant Diagnostic Results in last 30 days:  No results found.  Assessment/Plan 1.  Peripheral edema  RLE duplex Doppler  TED hose  Elevate legs when at rest  Family/ staff Communication:   Total Time:  Documentation:  Face to Face:  Family/Phone:   Labs/tests ordered: RLE duplex Doppler  Medication list reviewed and assessed for continued appropriateness.  Brynda Rim,  NP-C Geriatrics  [facility] [address], [place] [address] Cell Phone (Mon-Fri 8am-5pm):  [phone] On Call:  [phone] & follow prompts after 5pm & weekends Office Phone:  [phone] Office Fax:  [phone]

## 2017-07-17 ENCOUNTER — Encounter: Payer: Self-pay | Admitting: Gerontology

## 2017-07-17 ENCOUNTER — Non-Acute Institutional Stay: Payer: Medicare Other | Admitting: Gerontology

## 2017-07-17 DIAGNOSIS — F3340 Major depressive disorder, recurrent, in remission, unspecified: Secondary | ICD-10-CM | POA: Diagnosis not present

## 2017-07-17 DIAGNOSIS — I252 Old myocardial infarction: Secondary | ICD-10-CM

## 2017-07-17 DIAGNOSIS — M1990 Unspecified osteoarthritis, unspecified site: Secondary | ICD-10-CM | POA: Diagnosis not present

## 2017-07-17 DIAGNOSIS — E785 Hyperlipidemia, unspecified: Secondary | ICD-10-CM | POA: Diagnosis not present

## 2017-07-17 DIAGNOSIS — G301 Alzheimer's disease with late onset: Secondary | ICD-10-CM

## 2017-07-17 DIAGNOSIS — F419 Anxiety disorder, unspecified: Secondary | ICD-10-CM | POA: Diagnosis not present

## 2017-07-17 DIAGNOSIS — M8580 Other specified disorders of bone density and structure, unspecified site: Secondary | ICD-10-CM | POA: Diagnosis not present

## 2017-07-17 DIAGNOSIS — M797 Fibromyalgia: Secondary | ICD-10-CM

## 2017-07-17 DIAGNOSIS — F0281 Dementia in other diseases classified elsewhere with behavioral disturbance: Secondary | ICD-10-CM

## 2017-07-17 DIAGNOSIS — E538 Deficiency of other specified B group vitamins: Secondary | ICD-10-CM

## 2017-07-17 DIAGNOSIS — F02818 Dementia in other diseases classified elsewhere, unspecified severity, with other behavioral disturbance: Secondary | ICD-10-CM

## 2017-07-17 DIAGNOSIS — I25119 Atherosclerotic heart disease of native coronary artery with unspecified angina pectoris: Secondary | ICD-10-CM

## 2017-08-09 NOTE — Progress Notes (Signed)
Location:   [facility] Room Number: 247 Place of Service:  ALF [phone]) Provider:  Lorenso Quarry, NP-C  [name], MD  Patient Care Team: [name], MD as PCP - General (Internal Medicine)  Extended Emergency Contact Information Primary Emergency Contact: Mccaffrey,[name] Address: [address]          [place], [place] [address] [place] Home Phone: [phone] Mobile Phone: [phone] Relation: Spouse Secondary Emergency Contact: Lelon Perla [place] Home Phone: [phone] Work Phone: [phone] Mobile Phone: [phone] Relation: Niece  Code Status:  Full  Goals of care: Advanced Directive information Advanced Directives 07/17/2017  Does Patient Have a Medical Advance Directive? No  Type of Advance Directive -  Does patient want to make changes to medical advance directive? -  Copy of Healthcare Power of Attorney in Chart? -     Chief Complaint  Patient presents with  . Medical Management of Chronic Issues    Routine Visit    HPI:  [name] seen today for medical management of chronic diseases.  Patient has been stable over the past month.  Medication adjustments have been made for the anxiety and mood disorder.  This appears to have been effective.  Patient has less crying episodes and appears to be happier and more content.  No falls this month.  Her memory is worsening, she cannot member her husband visits 10 minutes after he leaves.  She realizes that she has poor short-term memory, causing worsening distress.  Patient does enjoy the company of others.  She enjoys working puzzles.  She performs ADLs independently.  Continent of bowel and bladder.  Feeds self.  Complains of early morning pain in the hands that diminishes with activity.  Activity is good.  Having regular BMs.  Patient did complain of some increased lateral lower extremity edema this month.  However,  the edema resolve with rest and elevation of BLE.  Vital signs stable.  No other complaints.   Past Medical History:  Diagnosis Date  . Allergy   . Anxiety   . Arthritis   . Asthma   . Baker's cyst    R knee  . Chest pain, unspecified 07/28/2013  . Coronary artery disease 07/28/2013  . Coronary heart disease   . Dementia   . Diverticulosis   . Encounter for Medicare annual wellness exam 09/29/2013  . Fibromyalgia   . Hematuria   . History of echocardiogram    EF > 55%, inf wall motion abnormalities, post MI  . Hyperlipidemia, unspecified    LDL 105 - Atorvostatin initiated 8/13, target LDL < 70  . Hypertension   . IBS (irritable bowel syndrome)   . Kidney stones   . Living will, counseling/discussion   . Mild dementia   . Myocardial infarct (HCC)    ST elevation MI  . Nephrolithiasis    [name]  . Osteoporosis   . Stented coronary artery    RCA 05/10/12, x2 DES mid and prox RCA  . UTI (lower urinary tract infection)    Past Surgical History:  Procedure Laterality Date  . ANGIOPLASTY    . APPENDECTOMY    . BREAST BIOPSY     benign nodule  . BREAST SURGERY     bxs x 5  . CARDIAC CATHETERIZATION  05/09/2012   [facility]  . CHOLECYSTECTOMY    . COLONOSCOPY  08/12/2008   Pt declined repeat in 2014  .  CORONARY ANGIOPLASTY WITH STENT PLACEMENT    . CYSTOSCOPY    . LITHOTRIPSY    . TONSILLECTOMY    . TOTAL ABDOMINAL HYSTERECTOMY W/ BILATERAL SALPINGOOPHORECTOMY    . URETEROSCOPY     with stent placement    Allergies  Allergen Reactions  . Epinephrine Other (See Comments)    Rapid heart beat.  . Dtap-Hepatitis B Recomb-Ipv Other (See Comments)  . Levofloxacin     Edema in knee  . Metronidazole     Nausea and edema in knee  . Other     Other reaction(s): Unknown Sympathomimetic agents   . Penicillins   . Tetanus Toxoids     Arm swelling  . Amoxicillin-Pot Clavulanate Rash    Allergies as of 07/17/2017      Reactions   Epinephrine Other (See Comments)    Rapid heart beat.   Dtap-hepatitis B Recomb-ipv Other (See Comments)   Levofloxacin    Edema in knee   Metronidazole    Nausea and edema in knee   Other    Other reaction(s): Unknown Sympathomimetic agents   Penicillins    Tetanus Toxoids    Arm swelling   Amoxicillin-pot Clavulanate Rash      Medication List        Accurate as of 07/17/17 11:59 PM. Always use your most recent med list.          acetaminophen 325 MG tablet Commonly known as:  TYLENOL Take 650 mg by mouth 2 (two) times daily.   aspirin 81 MG chewable tablet Chew 81 mg by mouth daily. With meal   atorvastatin 10 MG tablet Commonly known as:  LIPITOR Take 10 mg by mouth daily.   cyanocobalamin 1000 MCG tablet Take 1,000 mcg by mouth daily.   divalproex 125 MG capsule Commonly known as:  DEPAKOTE SPRINKLE Take 375 mg by mouth 2 (two) times daily. 3 caps   escitalopram 10 MG tablet Commonly known as:  LEXAPRO Take 10 mg by mouth daily.   galantamine 8 MG tablet Commonly known as:  RAZADYNE Take 8 mg by mouth 2 (two) times daily.   memantine 14 MG Cp24 24 hr capsule Commonly known as:  NAMENDA XR Take 14 mg by mouth daily. Take with meals   NITROSTAT 0.4 MG SL tablet Generic drug:  nitroGLYCERIN Place 1 tablet under the tongue every 5 (five) minutes as needed for chest pain.   traZODone 50 MG tablet Commonly known as:  DESYREL Take 25 mg by mouth 2 (two) times daily. 1/2 tab   Vitamin D3 2000 units capsule Take 2,000 Units by mouth daily.       Review of Systems  Constitutional: Negative for activity change, appetite change, chills, diaphoresis and fever.  HENT: Negative for congestion, sneezing, sore throat, trouble swallowing and voice change.   Respiratory: Negative for apnea, cough, choking, chest tightness, shortness of breath and wheezing.   Cardiovascular: Negative for chest pain, palpitations and leg swelling.  Gastrointestinal: Negative for abdominal distention, abdominal  pain, constipation, diarrhea and nausea.  Genitourinary: Negative for difficulty urinating, dysuria, frequency and urgency.  Musculoskeletal: Positive for arthralgias (typical arthritis). Negative for back pain, gait problem and myalgias.  Skin: Negative for color change, pallor, rash and wound.  Neurological: Negative for dizziness, tremors, syncope, speech difficulty, weakness, numbness and headaches.  Psychiatric/Behavioral: Positive for confusion. Negative for agitation and behavioral problems.  All other systems reviewed and are negative.   Immunization History  Administered Date(s) Administered  . Influenza Whole  07/10/2013  . Influenza-Unspecified 06/05/2017  . PPD Test 10/14/2016  . Pneumococcal Polysaccharide-23 08/31/2006   Pertinent  Health Maintenance Due  Topic Date Due  . DEXA SCAN  06/06/1998  . PNA vac Low Risk Adult (2 of 2 - PCV13) 09/01/2007  . INFLUENZA VACCINE  Completed   No flowsheet data found. Functional Status Survey:    Vitals:   07/17/17 0953  BP: 130/66  Pulse: 70  Resp: 16  Temp: 99 F (37.2 C)  TempSrc: Oral  SpO2: 96%  Weight: 161 lb 9.6 oz (73.3 kg)  Height: 5\' 3"  (1.6 m)   Body mass index is 28.63 kg/m. Physical Exam  Constitutional: She is oriented to person, place, and time. Vital signs are normal. She appears well-developed and well-nourished. She is active and cooperative. She does not appear ill. No distress.  HENT:  Head: Normocephalic and atraumatic.  Mouth/Throat: Uvula is midline, oropharynx is clear and moist and mucous membranes are normal. Mucous membranes are not pale, not dry and not cyanotic.  Eyes: Conjunctivae, EOM and lids are normal. Pupils are equal, round, and reactive to light.  Neck: Trachea normal, normal range of motion and full passive range of motion without pain. Neck supple. No JVD present. No tracheal deviation, no edema and no erythema present. No thyromegaly present.  Cardiovascular: Normal rate, regular  rhythm, normal heart sounds, intact distal pulses and normal pulses. Exam reveals no gallop, no distant heart sounds and no friction rub.  No murmur heard. Pulses:      Dorsalis pedis pulses are 2+ on the right side, and 2+ on the left side.  Minimal BLE edema  Pulmonary/Chest: Effort normal and breath sounds normal. No accessory muscle usage. No respiratory distress. She has no decreased breath sounds. She has no wheezes. She has no rhonchi. She has no rales. She exhibits no tenderness.  Abdominal: Soft. Normal appearance and bowel sounds are normal. She exhibits no distension and no ascites. There is no tenderness.  Musculoskeletal: Normal range of motion. She exhibits no edema or tenderness.  Expected osteoarthritis, stiffness  Neurological: She is alert and oriented to person, place, and time. She has normal strength.  Skin: Skin is warm, dry and intact. No rash noted. She is not diaphoretic. No cyanosis or erythema. No pallor. Nails show no clubbing.  Psychiatric: Her speech is normal and behavior is normal. Judgment and thought content normal. Her mood appears anxious. Cognition and memory are impaired. She exhibits abnormal recent memory.  Nursing note and vitals reviewed.   Labs reviewed: Recent Labs    10/17/16 0400 05/31/17 0455  NA 142 140  K 4.3 4.3  CL 105 103  CO2 28 30  GLUCOSE 77 80  BUN 13 15  CREATININE 0.77 0.76  CALCIUM 8.7* 8.8*  MG 2.5* 2.5*   Recent Labs    10/17/16 0400 05/31/17 0455  AST 26 22  ALT 17 12*  ALKPHOS 71 47  BILITOT 0.8 0.5  PROT 6.4* 6.5  ALBUMIN 3.4* 3.5   Recent Labs    10/17/16 0400 05/31/17 0455  WBC 4.7 5.0  NEUTROABS 2.6 2.5  HGB 12.6 12.8  HCT 36.7 37.5  MCV 86.0 90.3  PLT 175 169   Lab Results  Component Value Date   TSH 1.484 05/31/2017   No results found for: HGBA1C Lab Results  Component Value Date   CHOL 208 (H) 05/31/2017   HDL 52 05/31/2017   LDLCALC 126 (H) 05/31/2017   TRIG 151 (H) 05/31/2017  CHOLHDL 4.0 05/31/2017    Significant Diagnostic Results in last 30 days:  No results found.  Assessment/Plan 1. Late onset Alzheimer's disease with behavioral disturbance  Stable  Continue Namenda XR to 14 mg po Q Day  Continue galantamin 8 mg po BID  Continue Depakote Sprinkles 375 mg po BID for mood stabilization  2. Fibromyalgia  Stable   3. Atherosclerosis of native coronary artery of native heart with angina pectoris (HCC)  Stable  Continue ASA 81 mg po Q Day  Continue Nitroglycerin 0.4 mg tablet SL Q 5 minutes x 3 prn for angina  4. Old myocardial infarction  Stable   5. Major depressive disorder, recurrent, in remission, unspecified (HCC)  Stable  Continue Lexapro 10 mg po Q Day  6. Anxiety disorder, unspecified type  Variable  Continue Trazodone 25 mg po BID  Continue Namenda XR to 14 mg po Q Day  Depakote sprinkles 375 mg po BID for mood stabilization  7. Hyperlipidemia, unspecified hyperlipidemia type  Stable  Continue atorvastatin 10 mg p.o. daily  8. Osteopenia, unspecified location  Stable  Continue Cholecalciferol 2,000 units po Q Day  9. Vitamin B12 deficiency  Stable  Continue Cyanocobalamin 1,000 mcg po Q Day  10. Osteoarthritis, unspecified  Stable   Continue Tylenol 650 mg po BID  Family/ staff Communication:   Total Time:  Documentation:  Face to Face:  Family/Phone:   Labs/tests ordered: Not due  Medication list reviewed and assessed for continued appropriateness. Monthly medication orders reviewed and signed.  Brynda Rim, NP-C Geriatrics [facility] [phone] [address], [place] [address] Cell Phone (Mon-Fri 8am-5pm):  [phone] On Call:  [phone] & follow prompts after 5pm & weekends Office Phone:  [phone] Office Fax:  [phone]

## 2017-08-11 ENCOUNTER — Encounter
Admission: RE | Admit: 2017-08-11 | Discharge: 2017-08-11 | Disposition: A | Discharge status: Home / Self Care | Payer: Private Health Insurance - Indemnity | Source: Ambulatory Visit | Attending: Internal Medicine | Admitting: Internal Medicine

## 2017-08-23 ENCOUNTER — Encounter: Payer: Self-pay | Admitting: Gerontology

## 2017-08-23 ENCOUNTER — Non-Acute Institutional Stay: Payer: Medicare Other | Admitting: Gerontology

## 2017-08-23 DIAGNOSIS — H47019 Ischemic optic neuropathy, unspecified eye: Secondary | ICD-10-CM | POA: Diagnosis not present

## 2017-08-23 DIAGNOSIS — I25119 Atherosclerotic heart disease of native coronary artery with unspecified angina pectoris: Secondary | ICD-10-CM

## 2017-08-23 DIAGNOSIS — I252 Old myocardial infarction: Secondary | ICD-10-CM

## 2017-09-11 ENCOUNTER — Encounter
Admission: RE | Admit: 2017-09-11 | Discharge: 2017-09-11 | Disposition: A | Discharge status: Home / Self Care | Payer: Private Health Insurance - Indemnity | Source: Ambulatory Visit | Attending: Internal Medicine | Admitting: Internal Medicine

## 2017-09-26 ENCOUNTER — Non-Acute Institutional Stay: Payer: Medicare HMO | Admitting: Gerontology

## 2017-09-26 ENCOUNTER — Encounter: Payer: Self-pay | Admitting: Gerontology

## 2017-09-26 DIAGNOSIS — M8580 Other specified disorders of bone density and structure, unspecified site: Secondary | ICD-10-CM | POA: Diagnosis not present

## 2017-09-26 DIAGNOSIS — M1990 Unspecified osteoarthritis, unspecified site: Secondary | ICD-10-CM | POA: Diagnosis not present

## 2017-09-26 DIAGNOSIS — F0281 Dementia in other diseases classified elsewhere with behavioral disturbance: Secondary | ICD-10-CM | POA: Diagnosis not present

## 2017-09-26 DIAGNOSIS — G301 Alzheimer's disease with late onset: Secondary | ICD-10-CM

## 2017-09-26 DIAGNOSIS — F02818 Dementia in other diseases classified elsewhere, unspecified severity, with other behavioral disturbance: Secondary | ICD-10-CM

## 2017-09-29 NOTE — Assessment & Plan Note (Signed)
Stable for now. Pt resides on [facility]. Intermittent forgetfulness of situation, inducing anxiety. Re-directable. Pleasant. Tearful at times. On Namenda for the anxiety

## 2017-09-29 NOTE — Assessment & Plan Note (Signed)
Stable. No complaints of pain. Followed by Ophthamology as needed

## 2017-09-29 NOTE — Progress Notes (Signed)
Location:    Nursing Home Room Number: 247P Place of Service:  ALF [phone]) Provider:  Lorenso Quarry, NP-C  [name], MD  Patient Care Team: [name], MD as PCP - General (Internal Medicine)  Extended Emergency Contact Information Primary Emergency Contact: Sara Stout,Sara Stout Address: [address]          [place], [place] [address] [place] of [place] Home Phone: [phone] Mobile Phone: [phone] Relation: Spouse Secondary Emergency Contact: Lelon Perla [place] Home Phone: [phone] Work Phone: [phone] Mobile Phone: [phone] Relation: Niece  Code Status:  FULL Goals of care: Advanced Directive information Advanced Directives 09/26/2017  Does Patient Have a Medical Advance Directive? No  Type of Advance Directive -  Does patient want to make changes to medical advance directive? -  Copy of Healthcare Power of Attorney in Chart? -     Chief Complaint  Patient presents with  . Medical Management of Chronic Issues    Routine Visit    HPI:  Pt is a 84 y.o. female seen today for medical management of chronic diseases.    Old myocardial infarction Stable. No recent cardiac episodes  Atherosclerosis of coronary artery Stable. No recent cardiac episodes. Pt denies chest pain or shortness of breath  ION (ischemic optic neuropathy) Stable. No complaints of pain. Followed by Ophthamology as needed  Please not pt with dementia. Unable to obtain complete ROS. Some ROS info obtained from staff and documentation.   Past Medical History:  Diagnosis Date  . Allergy   . Anxiety   . Arthritis   . Asthma   . Baker's cyst    R knee  . Chest pain, unspecified 07/28/2013  . Coronary artery disease 07/28/2013  . Coronary heart disease   . Dementia   . Diverticulosis   . Encounter for Medicare annual wellness exam 09/29/2013  . Fibromyalgia   . Hematuria   . History of echocardiogram    EF > 55%, inf wall  motion abnormalities, post MI  . Hyperlipidemia, unspecified    LDL 105 - Atorvostatin initiated 8/13, target LDL < 70  . Hypertension   . IBS (irritable bowel syndrome)   . Kidney stones   . Living will, counseling/discussion   . Mild dementia   . Myocardial infarct (HCC)    ST elevation MI  . Nephrolithiasis    [name]  . Osteoporosis   . Stented coronary artery    RCA 05/10/12, x2 DES mid and prox RCA  . UTI (lower urinary tract infection)    Past Surgical History:  Procedure Laterality Date  . ANGIOPLASTY    . APPENDECTOMY    . BREAST BIOPSY     benign nodule  . BREAST SURGERY     bxs x 5  . CARDIAC CATHETERIZATION  05/09/2012   [facility]  . CHOLECYSTECTOMY    . COLONOSCOPY  08/12/2008   Pt declined repeat in 2014  . CORONARY ANGIOPLASTY WITH STENT PLACEMENT    . CYSTOSCOPY    . LITHOTRIPSY    . TONSILLECTOMY    . TOTAL ABDOMINAL HYSTERECTOMY W/ BILATERAL SALPINGOOPHORECTOMY    . URETEROSCOPY     with stent placement    Allergies  Allergen Reactions  . Epinephrine Other (See Comments)    Rapid heart beat.  . Dtap-Hepatitis B Recomb-Ipv Other (See Comments)  . Levofloxacin     Edema in knee  . Metronidazole     Nausea and edema in knee  . Other  Other reaction(s): Unknown Sympathomimetic agents   . Penicillins   . Tetanus Toxoids     Arm swelling  . Amoxicillin-Pot Clavulanate Rash    Allergies as of 08/23/2017      Reactions   Epinephrine Other (See Comments)   Rapid heart beat.   Dtap-hepatitis B Recomb-ipv Other (See Comments)   Levofloxacin    Edema in knee   Metronidazole    Nausea and edema in knee   Other    Other reaction(s): Unknown Sympathomimetic agents   Penicillins    Tetanus Toxoids    Arm swelling   Amoxicillin-pot Clavulanate Rash      Medication List        Accurate as of 08/23/17 11:59 PM. Always use your most recent med list.          acetaminophen 325 MG tablet Commonly known as:  TYLENOL Take 650 mg by mouth 2  (two) times daily.   aspirin 81 MG chewable tablet Chew 81 mg by mouth daily. With meal   atorvastatin 10 MG tablet Commonly known as:  LIPITOR Take 10 mg by mouth daily.   cyanocobalamin 1000 MCG tablet Take 1,000 mcg by mouth daily.   divalproex 125 MG capsule Commonly known as:  DEPAKOTE SPRINKLE Take 375 mg by mouth 2 (two) times daily. 3 caps   escitalopram 10 MG tablet Commonly known as:  LEXAPRO Take 10 mg by mouth daily.   galantamine 8 MG tablet Commonly known as:  RAZADYNE Take 8 mg by mouth 2 (two) times daily.   memantine 14 MG Cp24 24 hr capsule Commonly known as:  NAMENDA XR Take 14 mg by mouth daily. Take with meals   NITROSTAT 0.4 MG SL tablet Generic drug:  nitroGLYCERIN Place 1 tablet under the tongue every 5 (five) minutes as needed for chest pain.   traZODone 50 MG tablet Commonly known as:  DESYREL Take 25 mg by mouth 2 (two) times daily. 1/2 tab   Vitamin D3 2000 units capsule Take 2,000 Units by mouth daily.       Review of Systems  Unable to perform ROS: Dementia  Constitutional: Negative for activity change, appetite change, chills, diaphoresis and fever.  HENT: Negative for congestion, mouth sores, nosebleeds, postnasal drip, sneezing, sore throat, trouble swallowing and voice change.   Respiratory: Negative for apnea, cough, choking, chest tightness, shortness of breath and wheezing.   Cardiovascular: Negative for chest pain, palpitations and leg swelling.  Gastrointestinal: Negative for abdominal distention, abdominal pain, constipation, diarrhea and nausea.  Genitourinary: Negative for difficulty urinating, dysuria, frequency and urgency.  Musculoskeletal: Positive for arthralgias (typical arthritis). Negative for back pain, gait problem and myalgias.  Skin: Negative for color change, pallor, rash and wound.  Neurological: Positive for weakness. Negative for dizziness, tremors, syncope, speech difficulty, numbness and headaches.    Psychiatric/Behavioral: Negative for agitation and behavioral problems. The patient is nervous/anxious.   All other systems reviewed and are negative.   Immunization History  Administered Date(s) Administered  . Influenza Whole 07/10/2013  . Influenza-Unspecified 06/05/2017  . PPD Test 10/14/2016  . Pneumococcal Polysaccharide-23 08/31/2006   Pertinent  Health Maintenance Due  Topic Date Due  . DEXA SCAN  06/06/1998  . PNA vac Low Risk Adult (2 of 2 - PCV13) 09/01/2007  . INFLUENZA VACCINE  Completed   No flowsheet data found. Functional Status Survey:    Vitals:   08/23/17 1153  BP: (!) 119/59  Pulse: 75  Resp: 16  Temp: 99 F (  37.2 C)  TempSrc: Oral  SpO2: 96%  Weight: 165 lb 8 oz (75.1 kg)  Height: 5\' 3"  (1.6 m)   Body mass index is 29.32 kg/m. Physical Exam  Constitutional: Vital signs are normal. She appears well-developed and well-nourished. She is active and cooperative. She does not appear ill. No distress.  HENT:  Head: Normocephalic and atraumatic.  Mouth/Throat: Uvula is midline, oropharynx is clear and moist and mucous membranes are normal. Mucous membranes are not pale, not dry and not cyanotic.  Eyes: Conjunctivae, EOM and lids are normal. Pupils are equal, round, and reactive to light.  Neck: Trachea normal, normal range of motion and full passive range of motion without pain. Neck supple. No JVD present. No tracheal deviation, no edema and no erythema present. No thyromegaly present.  Cardiovascular: Normal rate, regular rhythm, normal heart sounds, intact distal pulses and normal pulses. Exam reveals no gallop, no distant heart sounds and no friction rub.  No murmur heard. Pulses:      Dorsalis pedis pulses are 2+ on the right side, and 2+ on the left side.  No edema  Pulmonary/Chest: Effort normal and breath sounds normal. No accessory muscle usage. No respiratory distress. She has no decreased breath sounds. She has no wheezes. She has no rhonchi.  She has no rales. She exhibits no tenderness.  Abdominal: Soft. Normal appearance and bowel sounds are normal. She exhibits no distension and no ascites. There is no tenderness.  Musculoskeletal: Normal range of motion. She exhibits no edema or tenderness.  Expected osteoarthritis, stiffness; Bilateral Calves soft, supple. Negative Homan's Sign. B- pedal pulses equal  Neurological: She is alert. She has normal strength. A sensory deficit is present.  Skin: Skin is warm, dry and intact. She is not diaphoretic. No cyanosis. No pallor. Nails show no clubbing.  Psychiatric: Her speech is normal. Judgment and thought content normal. Her mood appears anxious. She is slowed. Cognition and memory are impaired. She exhibits abnormal recent memory and abnormal remote memory.  Nursing note and vitals reviewed.   Labs reviewed: Recent Labs    10/17/16 0400 05/31/17 0455  NA 142 140  K 4.3 4.3  CL 105 103  CO2 28 30  GLUCOSE 77 80  BUN 13 15  CREATININE 0.77 0.76  CALCIUM 8.7* 8.8*  MG 2.5* 2.5*   Recent Labs    10/17/16 0400 05/31/17 0455  AST 26 22  ALT 17 12*  ALKPHOS 71 47  BILITOT 0.8 0.5  PROT 6.4* 6.5  ALBUMIN 3.4* 3.5   Recent Labs    10/17/16 0400 05/31/17 0455  WBC 4.7 5.0  NEUTROABS 2.6 2.5  HGB 12.6 12.8  HCT 36.7 37.5  MCV 86.0 90.3  PLT 175 169   Lab Results  Component Value Date   TSH 1.484 05/31/2017   No results found for: HGBA1C Lab Results  Component Value Date   CHOL 208 (H) 05/31/2017   HDL 52 05/31/2017   LDLCALC 126 (H) 05/31/2017   TRIG 151 (H) 05/31/2017   CHOLHDL 4.0 05/31/2017    Significant Diagnostic Results in last 30 days:  No results found.  Assessment/Plan Sara JacobsonHelen was seen today for medical management of chronic issues.  Diagnoses and all orders for this visit:  Old myocardial infarction  Atherosclerosis of native coronary artery of native heart with angina pectoris (HCC)  Ischemic optic neuropathy, unspecified  laterality   above conditions stable  Follow up with Cardiologist as needed  Follow up with Ophthamology as needed  Continue current medication regimen  Family/ staff Communication:   Total Time:  Documentation:  Face to Face:  Family/Phone:   Labs/tests ordered:  Not due  Medication list reviewed and assessed for continued appropriateness. Monthly medication orders reviewed and signed.  Brynda Rim, NP-C Geriatrics [facility] [phone] [address]. [address], [place] [address] Cell Phone (Mon-Fri 8am-5pm):  [phone] On Call:  [phone] & follow prompts after 5pm & weekends Office Phone:  [phone] Office Fax:  [phone]

## 2017-09-29 NOTE — Assessment & Plan Note (Signed)
Stable. No recent cardiac episodes

## 2017-09-29 NOTE — Assessment & Plan Note (Signed)
Stable. Continues on Cholecalciferol. Defer DEXA d/t age and co-morbidities

## 2017-09-29 NOTE — Progress Notes (Signed)
Location:    Nursing Home Room Number: 247P Place of Service:  ALF [phone]) Provider:  Lorenso Quarry, NP-C  [name], MD  Patient Care Team: [name], MD as PCP - General (Internal Medicine)  Extended Emergency Contact Information Primary Emergency Contact: Sara Stout Address: [address]          [place], [place] [address] Darden [place] of [place] Home Phone: [phone] Mobile Phone: [phone] Relation: Spouse Secondary Emergency Contact: Lelon Perla [place] of [place] Home Phone: [phone] Work Phone: [phone] Mobile Phone: [phone] Relation: Niece  Code Status:  FULL Goals of care: Advanced Directive information Advanced Directives 09/26/2017  Does Patient Have a Medical Advance Directive? No  Type of Advance Directive -  Does patient want to make changes to medical advance directive? -  Copy of Healthcare Power of Attorney in Chart? -     Chief Complaint  Patient presents with  . Medical Management of Chronic Issues    Routine Visit    HPI:  Pt is a 84 y.o. female seen today for medical management of chronic diseases.    Alzheimer's dementia, late onset Stable for now. Pt resides on [facility] Memory Care Unit. Intermittent forgetfulness of situation, inducing anxiety. Re-directable. Pleasant. Tearful at times. On Namenda for the anxiety  Osteopenia Stable. Continues on Cholecalciferol. Defer DEXA d/t age and co-morbidities  Osteoarthritis Stable. No complaints of increased symptoms as of late. Symptoms managed with BID scheduled tylenol  Please not pt with limited verbal ability. Unable to obtain complete ROS. Some ROS info obtained from staff and documentation.   Past Medical History:  Diagnosis Date  . Allergy   . Anxiety   . Arthritis   . Asthma   . Baker's cyst    R knee  . Chest pain, unspecified 07/28/2013  . Coronary artery disease 07/28/2013  . Coronary heart disease   . Dementia   .  Diverticulosis   . Encounter for Medicare annual wellness exam 09/29/2013  . Fibromyalgia   . Hematuria   . History of echocardiogram    EF > 55%, inf wall motion abnormalities, post MI  . Hyperlipidemia, unspecified    LDL 105 - Atorvostatin initiated 8/13, target LDL < 70  . Hypertension   . IBS (irritable bowel syndrome)   . Kidney stones   . Living will, counseling/discussion   . Mild dementia   . Myocardial infarct (HCC)    ST elevation MI  . Nephrolithiasis    [name]  . Osteoporosis   . Stented coronary artery    RCA 05/10/12, x2 DES mid and prox RCA  . UTI (lower urinary tract infection)    Past Surgical History:  Procedure Laterality Date  . ANGIOPLASTY    . APPENDECTOMY    . BREAST BIOPSY     benign nodule  . BREAST SURGERY     bxs x 5  . CARDIAC CATHETERIZATION  05/09/2012   [facility]  . CHOLECYSTECTOMY    . COLONOSCOPY  08/12/2008   Pt declined repeat in 2014  . CORONARY ANGIOPLASTY WITH STENT PLACEMENT    . CYSTOSCOPY    . LITHOTRIPSY    . TONSILLECTOMY    . TOTAL ABDOMINAL HYSTERECTOMY W/ BILATERAL SALPINGOOPHORECTOMY    . URETEROSCOPY     with stent placement    Allergies  Allergen Reactions  . Epinephrine Other (See Comments)    Rapid heart beat.  . Dtap-Hepatitis B Recomb-Ipv Other (See Comments)  . Levofloxacin  Edema in knee  . Metronidazole     Nausea and edema in knee  . Other     Other reaction(s): Unknown Sympathomimetic agents   . Penicillins   . Tetanus Toxoids     Arm swelling  . Amoxicillin-Pot Clavulanate Rash    Allergies as of 09/26/2017      Reactions   Epinephrine Other (See Comments)   Rapid heart beat.   Dtap-hepatitis B Recomb-ipv Other (See Comments)   Levofloxacin    Edema in knee   Metronidazole    Nausea and edema in knee   Other    Other reaction(s): Unknown Sympathomimetic agents   Penicillins    Tetanus Toxoids    Arm swelling   Amoxicillin-pot Clavulanate Rash      Medication List        Accurate  as of 09/26/17 11:59 PM. Always use your most recent med list.          acetaminophen 325 MG tablet Commonly known as:  TYLENOL Take 650 mg by mouth 2 (two) times daily.   aspirin 81 MG chewable tablet Chew 81 mg by mouth daily. With meal   atorvastatin 10 MG tablet Commonly known as:  LIPITOR Take 10 mg by mouth daily.   cyanocobalamin 1000 MCG tablet Take 1,000 mcg by mouth daily.   divalproex 125 MG capsule Commonly known as:  DEPAKOTE SPRINKLE Take 375 mg by mouth 2 (two) times daily. 3 caps   escitalopram 10 MG tablet Commonly known as:  LEXAPRO Take 10 mg by mouth daily.   galantamine 8 MG tablet Commonly known as:  RAZADYNE Take 8 mg by mouth 2 (two) times daily.   memantine 14 MG Cp24 24 hr capsule Commonly known as:  NAMENDA XR Take 14 mg by mouth daily. Take with meals   NITROSTAT 0.4 MG SL tablet Generic drug:  nitroGLYCERIN Place 1 tablet under the tongue every 5 (five) minutes as needed for chest pain.   traZODone 50 MG tablet Commonly known as:  DESYREL Take 25 mg by mouth 2 (two) times daily. 1/2 tab   Vitamin D3 2000 units capsule Take 2,000 Units by mouth daily.       Review of Systems  Unable to perform ROS: Dementia  Constitutional: Negative for activity change, appetite change, chills, diaphoresis and fever.  HENT: Negative for congestion, mouth sores, nosebleeds, postnasal drip, sneezing, sore throat, trouble swallowing and voice change.   Respiratory: Negative for apnea, cough, choking, chest tightness, shortness of breath and wheezing.   Cardiovascular: Negative for chest pain, palpitations and leg swelling.  Gastrointestinal: Negative for abdominal distention, abdominal pain, constipation, diarrhea and nausea.  Genitourinary: Negative for difficulty urinating, dysuria, frequency and urgency.  Musculoskeletal: Positive for arthralgias (typical arthritis). Negative for back pain, gait problem and myalgias.  Skin: Negative for color  change, pallor, rash and wound.  Neurological: Positive for weakness. Negative for dizziness, tremors, syncope, speech difficulty, numbness and headaches.  Psychiatric/Behavioral: Positive for confusion. Negative for agitation and behavioral problems. The patient is nervous/anxious.   All other systems reviewed and are negative.   Immunization History  Administered Date(s) Administered  . Influenza Whole 07/10/2013  . Influenza-Unspecified 06/05/2017  . PPD Test 10/14/2016  . Pneumococcal Polysaccharide-23 08/31/2006   Pertinent  Health Maintenance Due  Topic Date Due  . DEXA SCAN  06/06/1998  . PNA vac Low Risk Adult (2 of 2 - PCV13) 09/01/2007  . INFLUENZA VACCINE  Completed   No flowsheet data found. Functional  Status Survey:    Vitals:   09/26/17 1052  BP: 122/72  Pulse: 72  Resp: 16  Temp: 97.6 F (36.4 C)  TempSrc: Oral  SpO2: 96%  Weight: 166 lb 3.2 oz (75.4 kg)  Height: 5\' 3"  (1.6 m)   Body mass index is 29.44 kg/m. Physical Exam  Constitutional: Vital signs are normal. She appears well-developed and well-nourished. She is active and cooperative. She does not appear ill. No distress.  HENT:  Head: Normocephalic and atraumatic.  Mouth/Throat: Uvula is midline, oropharynx is clear and moist and mucous membranes are normal. Mucous membranes are not pale, not dry and not cyanotic.  Eyes: Conjunctivae, EOM and lids are normal. Pupils are equal, round, and reactive to light.  Neck: Trachea normal, normal range of motion and full passive range of motion without pain. Neck supple. No JVD present. No tracheal deviation, no edema and no erythema present. No thyromegaly present.  Cardiovascular: Normal rate, regular rhythm, normal heart sounds, intact distal pulses and normal pulses. Exam reveals no gallop, no distant heart sounds and no friction rub.  No murmur heard. Pulses:      Dorsalis pedis pulses are 2+ on the right side, and 2+ on the left side.  No edema    Pulmonary/Chest: Effort normal and breath sounds normal. No accessory muscle usage. No respiratory distress. She has no decreased breath sounds. She has no wheezes. She has no rhonchi. She has no rales. She exhibits no tenderness.  Abdominal: Soft. Normal appearance and bowel sounds are normal. She exhibits no distension and no ascites. There is no tenderness.  Musculoskeletal: Normal range of motion. She exhibits no edema or tenderness.  Expected osteoarthritis, stiffness; Bilateral Calves soft, supple. Negative Homan's Sign. B- pedal pulses equal; some weakness; ambulatory without assistive devices  Neurological: She is alert. She has normal strength. A sensory deficit is present.  Skin: Skin is warm, dry and intact. She is not diaphoretic. No cyanosis. No pallor. Nails show no clubbing.  Psychiatric: Her speech is normal. Judgment and thought content normal. Her mood appears anxious. She is slowed. Cognition and memory are impaired. She exhibits abnormal recent memory and abnormal remote memory.  Nursing note and vitals reviewed.   Labs reviewed: Recent Labs    10/17/16 0400 05/31/17 0455  NA 142 140  K 4.3 4.3  CL 105 103  CO2 28 30  GLUCOSE 77 80  BUN 13 15  CREATININE 0.77 0.76  CALCIUM 8.7* 8.8*  MG 2.5* 2.5*   Recent Labs    10/17/16 0400 05/31/17 0455  AST 26 22  ALT 17 12*  ALKPHOS 71 47  BILITOT 0.8 0.5  PROT 6.4* 6.5  ALBUMIN 3.4* 3.5   Recent Labs    10/17/16 0400 05/31/17 0455  WBC 4.7 5.0  NEUTROABS 2.6 2.5  HGB 12.6 12.8  HCT 36.7 37.5  MCV 86.0 90.3  PLT 175 169   Lab Results  Component Value Date   TSH 1.484 05/31/2017   No results found for: HGBA1C Lab Results  Component Value Date   CHOL 208 (H) 05/31/2017   HDL 52 05/31/2017   LDLCALC 126 (H) 05/31/2017   TRIG 151 (H) 05/31/2017   CHOLHDL 4.0 05/31/2017    Significant Diagnostic Results in last 30 days:  No results found.  Assessment/Plan Jailyne was seen today for medical  management of chronic issues.  Diagnoses and all orders for this visit:  Late onset Alzheimer's disease with behavioral disturbance  Osteopenia, unspecified location  Osteoarthritis, unspecified osteoarthritis type, unspecified site   above conditions stable this month  Continue current medication regimen  Patient to continue residing on AL Memory Care Unit  Continue to monitor closely for worsening symptoms of pain or worsening behaviors  Family/ staff Communication:   Total Time:  Documentation:  Face to Face:  Family/Phone:   Labs/tests ordered:  Not due  Medication list reviewed and assessed for continued appropriateness. Monthly medication orders reviewed and signed.  Brynda RimShannon H. Rucha Wissinger, NP-C Geriatrics [facility] [phone] [address] [place], [place] [address] Cell Phone (Mon-Fri 8am-5pm):  [phone] On Call:  [phone] & follow prompts after 5pm & weekends Office Phone:  [phone] Office Fax:  [phone]

## 2017-09-29 NOTE — Assessment & Plan Note (Signed)
Stable. No complaints of increased symptoms as of late. Symptoms managed with BID scheduled tylenol

## 2017-09-29 NOTE — Assessment & Plan Note (Signed)
Stable. No recent cardiac episodes. Pt denies chest pain or shortness of breath

## 2017-10-12 ENCOUNTER — Encounter
Admission: RE | Admit: 2017-10-12 | Discharge: 2017-10-12 | Disposition: A | Discharge status: Home / Self Care | Payer: Private Health Insurance - Indemnity | Source: Ambulatory Visit | Attending: Internal Medicine | Admitting: Internal Medicine

## 2017-10-25 ENCOUNTER — Non-Acute Institutional Stay: Payer: Medicare HMO | Admitting: Gerontology

## 2017-10-25 ENCOUNTER — Encounter: Payer: Self-pay | Admitting: Gerontology

## 2017-10-25 DIAGNOSIS — F3340 Major depressive disorder, recurrent, in remission, unspecified: Secondary | ICD-10-CM

## 2017-10-25 DIAGNOSIS — R609 Edema, unspecified: Secondary | ICD-10-CM

## 2017-10-25 DIAGNOSIS — E538 Deficiency of other specified B group vitamins: Secondary | ICD-10-CM | POA: Diagnosis not present

## 2017-11-09 ENCOUNTER — Encounter
Admission: RE | Admit: 2017-11-09 | Discharge: 2017-11-09 | Disposition: A | Discharge status: Home / Self Care | Payer: Private Health Insurance - Indemnity | Source: Ambulatory Visit | Attending: Internal Medicine | Admitting: Internal Medicine

## 2017-11-19 ENCOUNTER — Ambulatory Visit (INDEPENDENT_AMBULATORY_CARE_PROVIDER_SITE_OTHER): Payer: Medicare HMO | Admitting: Podiatry

## 2017-11-19 ENCOUNTER — Ambulatory Visit (INDEPENDENT_AMBULATORY_CARE_PROVIDER_SITE_OTHER): Payer: Medicare HMO

## 2017-11-19 ENCOUNTER — Encounter: Payer: Self-pay | Admitting: Podiatry

## 2017-11-19 VITALS — BP 138/69 | HR 80

## 2017-11-19 DIAGNOSIS — M129 Arthropathy, unspecified: Secondary | ICD-10-CM

## 2017-11-19 DIAGNOSIS — M2011 Hallux valgus (acquired), right foot: Secondary | ICD-10-CM

## 2017-11-19 DIAGNOSIS — M722 Plantar fascial fibromatosis: Secondary | ICD-10-CM

## 2017-11-19 DIAGNOSIS — R52 Pain, unspecified: Secondary | ICD-10-CM

## 2017-11-19 NOTE — Progress Notes (Signed)
   Subjective:    Patient ID: Sara Stout, female    DOB: [birth year], 84 y.o.   MRN: [id number]  HPIthis patient presents to the office with chief complaint of minor pain in her right heel. She is accompanied to this visit by her husband  She says she experiences mild discomfort upon rising from a sitting position.  She says she is not experiencing any pain or discomfort at this visit.  She has changed her shoe and believes there is a better arch support in the shoe. His has led to her pain, resolving.  She has not taken any medication or sought any professional help for this condition.  She presents the office today for an evaluation and treatment of her painful right heel.    Review of Systems  All other systems reviewed and are negative.      Objective:   Physical Exam General Appearance  Alert, conversant and in no acute stress.  Vascular  Dorsalis pedis and posterior tibial  pulses are palpable  bilaterally.  Capillary return is within normal limits  bilaterally. Temperature is within normal limits  bilaterally.  Neurologic  Senn-Weinstein monofilament wire test within normal limits  bilaterally. Muscle power within normal limits bilaterally.  Nails Elongated nails right foot with no evidence of infection.  Orthopedic  No limitations of motion of motion feet .  No crepitus or effusions noted.  HAV  B/L.  No palpable pain heel right foot.  Skin  normotropic skin with no porokeratosis noted bilaterally.  No signs of infections or ulcers noted.          Assessment & Plan:  Plantar Fasciitis right heel.  IE  Xray reveal midfoot arthritis.  Severe HAV right.  Minimal calcification at the insertion plantar fascia.  Dispensed plantar fascial brace..  Discussed additional treatments such as medication and injection therapy and immobilization.  We decided to treat her with the brace. Since she has minimal pain at this visit.  RTC prn.   Helane GuntherGregory Sahiba Granholm DPM

## 2017-11-28 ENCOUNTER — Non-Acute Institutional Stay: Payer: Medicare HMO | Admitting: Gerontology

## 2017-11-28 DIAGNOSIS — M797 Fibromyalgia: Secondary | ICD-10-CM | POA: Diagnosis not present

## 2017-11-28 DIAGNOSIS — F419 Anxiety disorder, unspecified: Secondary | ICD-10-CM | POA: Diagnosis not present

## 2017-11-28 DIAGNOSIS — E785 Hyperlipidemia, unspecified: Secondary | ICD-10-CM | POA: Diagnosis not present

## 2017-11-29 ENCOUNTER — Encounter: Payer: Self-pay | Admitting: Gerontology

## 2017-12-03 DIAGNOSIS — R609 Edema, unspecified: Secondary | ICD-10-CM | POA: Insufficient documentation

## 2017-12-03 NOTE — Assessment & Plan Note (Signed)
Stable. Much improvement in the last few months. Stable on Lexapro 20 mg  PO Q Day and Depakote 375 mg po BID. Namenda XR has been reduced to 14 mg Q Day as it can at times be too stimulating.

## 2017-12-03 NOTE — Progress Notes (Signed)
Location:    Nursing Home Room Number: 247P Place of Service:  ALF [phone]) Provider:  Lorenso Quarry, NP-C  [name], MD  Patient Care Team: [name], MD as PCP - General (Internal Medicine)  Extended Emergency Contact Information Primary Emergency Contact: Guggenheim,[name] Address: [address]          [place], [place] [address] D[place] of [place] Home Phone: [phone] Mobile Phone: [phone] Relation: Spouse Secondary Emergency Contact: Lelon Perla [place] Home Phone: [phone] Work Phone: [phone] Mobile Phone: [phone] Relation: Niece  Code Status:  full Goals of care: Advanced Directive information Advanced Directives 11/28/2017  Does Patient Have a Medical Advance Directive? No  Type of Advance Directive -  Does patient want to make changes to medical advance directive? -  Copy of Healthcare Power of Attorney in Chart? -     Chief Complaint  Patient presents with  . Medical Management of Chronic Issues    Routine Visit    HPI:  Pt is a 84 y.o. female seen today for medical management of chronic diseases.    Vitamin B12 deficiency Stable. On Cyanocobalamin 1,000 mcg po Q Day. Levels not quite due to be checked.   Major depressive disorder, recurrent, in remission, unspecified (HCC) Stable. Big improvement over the past few months. Pt continues on Lexapro 10 mg po Q Day with the addition of Depakote 375 mg po BID. No recent exacerbations of symptoms  Peripheral edema Mild. Pt has Lasix 20 mg po Q Day prn. Add TED Hose to the regimen daily. Elevate feet when at rest. Pt denies chest pain or shortness of breath, no cough  Please note pt with limited verbal/cognitive ability. Unable to obtain complete ROS. Some ROS info obtained from staff and documentation.   Past Medical History:  Diagnosis Date  . Allergy   . Anxiety   . Arthritis   . Asthma   . Baker's cyst    R knee  . Chest pain,  unspecified 07/28/2013  . Coronary artery disease 07/28/2013  . Coronary heart disease   . Dementia   . Diverticulosis   . Encounter for Medicare annual wellness exam 09/29/2013  . Fibromyalgia   . Hematuria   . History of echocardiogram    EF > 55%, inf wall motion abnormalities, post MI  . Hyperlipidemia, unspecified    LDL 105 - Atorvostatin initiated 8/13, target LDL < 70  . Hypertension   . IBS (irritable bowel syndrome)   . Kidney stones   . Living will, counseling/discussion   . Mild dementia   . Myocardial infarct (HCC)    ST elevation MI  . Nephrolithiasis    [name]  . Osteoporosis   . Stented coronary artery    RCA 05/10/12, x2 DES mid and prox RCA  . UTI (lower urinary tract infection)    Past Surgical History:  Procedure Laterality Date  . ANGIOPLASTY    . APPENDECTOMY    . BREAST BIOPSY     benign nodule  . BREAST SURGERY     bxs x 5  . CARDIAC CATHETERIZATION  05/09/2012   [facility]  . CHOLECYSTECTOMY    . COLONOSCOPY  08/12/2008   Pt declined repeat in 2014  . CORONARY ANGIOPLASTY WITH STENT PLACEMENT    . CYSTOSCOPY    . LITHOTRIPSY    . TONSILLECTOMY    . TOTAL ABDOMINAL HYSTERECTOMY W/ BILATERAL SALPINGOOPHORECTOMY    . URETEROSCOPY     with  stent placement    Allergies  Allergen Reactions  . Epinephrine Other (See Comments)    Rapid heart beat.  . Dtap-Hepatitis B Recomb-Ipv Other (See Comments)  . Levofloxacin Swelling    Edema in knee Edema in knee  . Metronidazole Nausea Only    Nausea and edema in knee Nausea and edema in knee  . Other     Other reaction(s): Unknown Sympathomimetic agents   . Penicillins   . Tetanus Toxoids     Arm swelling  . Amoxicillin-Pot Clavulanate Rash    Allergies as of 10/25/2017      Reactions   Epinephrine Other (See Comments)   Rapid heart beat.   Dtap-hepatitis B Recomb-ipv Other (See Comments)   Levofloxacin    Edema in knee   Metronidazole    Nausea and edema in knee   Other    Other  reaction(s): Unknown Sympathomimetic agents   Penicillins    Tetanus Toxoids    Arm swelling   Amoxicillin-pot Clavulanate Rash      Medication List        Accurate as of 10/25/17 11:59 PM. Always use your most recent med list.          acetaminophen 325 MG tablet Commonly known as:  TYLENOL Take 650 mg by mouth 2 (two) times daily.   aspirin 81 MG chewable tablet Chew 81 mg by mouth daily. With meal   atorvastatin 10 MG tablet Commonly known as:  LIPITOR Take 10 mg by mouth daily.   cyanocobalamin 1000 MCG tablet Take 1,000 mcg by mouth daily.   divalproex 125 MG capsule Commonly known as:  DEPAKOTE SPRINKLE Take 375 mg by mouth 2 (two) times daily. 3 caps   escitalopram 10 MG tablet Commonly known as:  LEXAPRO Take 10 mg by mouth daily.   galantamine 8 MG tablet Commonly known as:  RAZADYNE Take 8 mg by mouth 2 (two) times daily. give one at 8 am and one at 5 pm   memantine 14 MG Cp24 24 hr capsule Commonly known as:  NAMENDA XR Take 14 mg by mouth daily. Take with meals   NITROSTAT 0.4 MG SL tablet Generic drug:  nitroGLYCERIN Place 1 tablet under the tongue every 5 (five) minutes as needed for chest pain.   traZODone 50 MG tablet Commonly known as:  DESYREL Take 25 mg by mouth 2 (two) times daily. 1/2 tab   Vitamin D3 2000 units capsule Take 2,000 Units by mouth daily.       Review of Systems  Unable to perform ROS: Dementia  Constitutional: Negative for activity change, appetite change, chills, diaphoresis and fever.  HENT: Negative for congestion, mouth sores, nosebleeds, postnasal drip, sneezing, sore throat, trouble swallowing and voice change.   Respiratory: Negative for apnea, cough, choking, chest tightness, shortness of breath and wheezing.   Cardiovascular: Positive for leg swelling. Negative for chest pain and palpitations.  Gastrointestinal: Negative for abdominal distention, abdominal pain, constipation, diarrhea and nausea.    Genitourinary: Negative for difficulty urinating, dysuria, frequency and urgency.  Musculoskeletal: Negative for back pain, gait problem and myalgias. Arthralgias: typical arthritis.  Skin: Negative for color change, pallor, rash and wound.  Neurological: Negative for dizziness, tremors, syncope, speech difficulty, weakness, numbness and headaches.  Psychiatric/Behavioral: Positive for confusion. Negative for agitation and behavioral problems.  All other systems reviewed and are negative.   Immunization History  Administered Date(s) Administered  . Influenza Whole 07/10/2013  . Influenza-Unspecified 06/05/2017  . PPD Test  10/14/2016  . Pneumococcal Polysaccharide-23 08/31/2006   Pertinent  Health Maintenance Due  Topic Date Due  . DEXA SCAN  06/06/1998  . PNA vac Low Risk Adult (2 of 2 - PCV13) 09/01/2007  . INFLUENZA VACCINE  Completed   No flowsheet data found. Functional Status Survey:    Vitals:   10/25/17 1334  BP: (!) 117/57  Pulse: 71  Resp: 16  Temp: 97.6 F (36.4 C)  TempSrc: Oral  SpO2: 98%  Weight: 168 lb 14.4 oz (76.6 kg)  Height: 5\' 3"  (1.6 m)   Body mass index is 29.92 kg/m. Physical Exam  Constitutional: Vital signs are normal. She appears well-developed and well-nourished. She is active and cooperative. She does not appear ill. No distress.  HENT:  Head: Normocephalic and atraumatic.  Mouth/Throat: Uvula is midline, oropharynx is clear and moist and mucous membranes are normal. Mucous membranes are not pale, not dry and not cyanotic.  Eyes: Pupils are equal, round, and reactive to light. Conjunctivae, EOM and lids are normal.  Neck: Trachea normal, normal range of motion and full passive range of motion without pain. Neck supple. No JVD present. No tracheal deviation, no edema and no erythema present. No thyromegaly present.  Cardiovascular: Normal rate, regular rhythm, normal heart sounds, intact distal pulses and normal pulses. Exam reveals no gallop,  no distant heart sounds and no friction rub.  No murmur heard. Pulses:      Dorsalis pedis pulses are 2+ on the right side, and 2+ on the left side.  1+ BLE edema  Pulmonary/Chest: Effort normal and breath sounds normal. No accessory muscle usage. No respiratory distress. She has no decreased breath sounds. She has no wheezes. She has no rhonchi. She has no rales. She exhibits no tenderness.  Abdominal: Soft. Normal appearance and bowel sounds are normal. She exhibits no distension and no ascites. There is no tenderness.  Musculoskeletal: Normal range of motion. She exhibits no edema or tenderness.  Expected osteoarthritis, stiffness; Bilateral Calves soft, supple. Negative Homan's Sign. B- pedal pulses equal  Neurological: She is alert. She has normal strength. She displays atrophy. Coordination abnormal.  Skin: Skin is warm, dry and intact. She is not diaphoretic. No cyanosis. No pallor. Nails show no clubbing.  Psychiatric: Her speech is normal. Judgment and thought content normal. She is withdrawn. Cognition and memory are impaired. She exhibits a depressed mood. She exhibits abnormal recent memory and abnormal remote memory.  Nursing note and vitals reviewed.   Labs reviewed: Recent Labs    05/31/17 0455  NA 140  K 4.3  CL 103  CO2 30  GLUCOSE 80  BUN 15  CREATININE 0.76  CALCIUM 8.8*  MG 2.5*   Recent Labs    05/31/17 0455  AST 22  ALT 12*  ALKPHOS 47  BILITOT 0.5  PROT 6.5  ALBUMIN 3.5   Recent Labs    05/31/17 0455  WBC 5.0  NEUTROABS 2.5  HGB 12.8  HCT 37.5  MCV 90.3  PLT 169   Lab Results  Component Value Date   TSH 1.484 05/31/2017   No results found for: HGBA1C Lab Results  Component Value Date   CHOL 208 (H) 05/31/2017   HDL 52 05/31/2017   LDLCALC 126 (H) 05/31/2017   TRIG 151 (H) 05/31/2017   CHOLHDL 4.0 05/31/2017    Significant Diagnostic Results in last 30 days:  No results found.  Assessment/Plan Henslee was seen today for medical  management of chronic issues.  Diagnoses and  all orders for this visit:  Vitamin B12 deficiency  Major depressive disorder, recurrent, in remission, unspecified (HCC)  Peripheral edema   Above conditions stable  Continue current medication regimen  Add daily knee high TED hose- on in the early am, off in the pm  Elevate legs when at rest  Monitor for worsening depressive symptoms  Encourage pt interaction with other residents and activities  Encourage well rounded diet  Family/ staff Communication:   Total Time:  Documentation:  Face to Face:  Family/Phone:   Labs/tests ordered:  Not due  Medication list reviewed and assessed for continued appropriateness. Monthly medication orders reviewed and signed.  Brynda RimShannon H. Mykelti Goldenstein, NP-C Geriatrics [facility] [phone] [address] [place], [place] [address] Cell Phone (Mon-Fri 8am-5pm):  [phone] On Call:  [phone] & follow prompts after 5pm & weekends Office Phone:  [phone] Office Fax:  [phone]

## 2017-12-03 NOTE — Assessment & Plan Note (Signed)
Stable. On Cyanocobalamin 1,000 mcg po Q Day. Levels not quite due to be checked.

## 2017-12-03 NOTE — Assessment & Plan Note (Signed)
Stable. Not currently on statin as benefit is limited. Will recheck levels next month

## 2017-12-03 NOTE — Progress Notes (Signed)
Location:    Nursing Home Room Number: 701X Place of Service:  ALF [phone]) Provider:  Toni Arthurs, NP-C  Kirk Ruths, MD  Patient Care Team: Kirk Ruths, MD as PCP - General (Internal Medicine)  Extended Emergency Contact Information Primary Emergency Contact: Belitz,[name] Address: [address] [place], [place] [address] Johnnette Litter of [place] Phone: [phone] Mobile Phone: [phone] Relation: Spouse Secondary Emergency Contact: Jonah Blue [place] of [place] Phone: [phone] Work Phone: [phone] Mobile Phone: [phone] Relation: Niece  Code Status:  full Goals of care: Advanced Directive information Advanced Directives 11/28/2017  Does Patient Have a Medical Advance Directive? No  Type of Advance Directive -  Does patient want to make changes to medical advance directive? -  Copy of [place] in Chart? -     Chief Complaint  Patient presents with  . Medical Management of Chronic Issues    Routine Visit    HPI:  Pt is a 84 y.o. female seen today for medical management of chronic diseases.    Hyperlipidemia Stable. Not currently on statin as benefit is limited. Will recheck levels next month  Fibromyalgia Stable. Pt denies pain. Symptoms controlled with Lexapro and scheduled Tylenol 650 mg po BID.   Anxiety disorder Stable. Much improvement in the last few months. Stable on Lexapro 20 mg  PO Q Day and Depakote 375 mg po BID. Namenda XR has been reduced to 14 mg Q Day as it can at times be too stimulating.   Please note pt with limited verbal/cognitive ability. Unable to obtain complete ROS. Some ROS info obtained from staff and documentation.   Past Medical History:  Diagnosis Date  . Allergy   . Anxiety   . Arthritis   . Asthma   . Baker's cyst    R knee  . Chest pain, unspecified 07/28/2013  . Coronary artery disease 07/28/2013  . Coronary heart disease   .  Dementia   . Diverticulosis   . Encounter for Medicare annual wellness exam 09/29/2013  . Fibromyalgia   . Hematuria   . History of echocardiogram    EF > 55%, inf wall motion abnormalities, post MI  . Hyperlipidemia, unspecified    LDL 105 - Atorvostatin initiated 8/13, target LDL < 70  . Hypertension   . IBS (irritable bowel syndrome)   . Kidney stones   . Living will, counseling/discussion   . Mild dementia   . Myocardial infarct (HCC)    ST elevation MI  . Nephrolithiasis    [name]  . Osteoporosis   . Stented coronary artery    RCA 05/10/12, x2 DES mid and prox RCA  . UTI (lower urinary tract infection)    Past Surgical History:  Procedure Laterality Date  . ANGIOPLASTY    . APPENDECTOMY    . BREAST BIOPSY     benign nodule  . BREAST SURGERY     bxs x 5  . CARDIAC CATHETERIZATION  05/09/2012   [facility]  . CHOLECYSTECTOMY    . COLONOSCOPY  08/12/2008   Pt declined repeat in 2014  . CORONARY ANGIOPLASTY WITH STENT PLACEMENT    . CYSTOSCOPY    . LITHOTRIPSY    . TONSILLECTOMY    . TOTAL ABDOMINAL HYSTERECTOMY W/ BILATERAL SALPINGOOPHORECTOMY    . URETEROSCOPY     with stent placement    Allergies  Allergen Reactions  . Epinephrine Other (See Comments)  Rapid heart beat.  . Dtap-Hepatitis B Recomb-Ipv Other (See Comments)  . Levofloxacin Swelling    Edema in knee Edema in knee  . Metronidazole Nausea Only    Nausea and edema in knee Nausea and edema in knee  . Other     Other reaction(s): Unknown Sympathomimetic agents   . Penicillins   . Tetanus Toxoids     Arm swelling  . Amoxicillin-Pot Clavulanate Rash    Allergies as of 11/28/2017      Reactions   Epinephrine Other (See Comments)   Rapid heart beat.   Dtap-hepatitis B Recomb-ipv Other (See Comments)   Levofloxacin Swelling   Edema in knee Edema in knee   Metronidazole Nausea Only   Nausea and edema in knee Nausea and edema in knee   Other    Other reaction(s): Unknown Sympathomimetic  agents   Penicillins    Tetanus Toxoids    Arm swelling   Amoxicillin-pot Clavulanate Rash      Medication List        Accurate as of 11/28/17 11:59 PM. Always use your most recent med list.          acetaminophen 325 MG tablet Commonly known as:  TYLENOL Take 650 mg by mouth 2 (two) times daily.   aspirin 81 MG chewable tablet Chew 81 mg by mouth daily. With meal   cyanocobalamin 1000 MCG tablet Take 1,000 mcg by mouth daily.   divalproex 125 MG capsule Commonly known as:  DEPAKOTE SPRINKLE Take 375 mg by mouth 2 (two) times daily. 3 caps   escitalopram 10 MG tablet Commonly known as:  LEXAPRO Take 10 mg by mouth daily.   furosemide 20 MG tablet Commonly known as:  LASIX Take 20 mg by mouth daily as needed. as needed for Edema per Dr. Clayborn Bigness   galantamine 8 MG tablet Commonly known as:  RAZADYNE Take 8 mg by mouth 2 (two) times daily. give one at 8 am and one at 5 pm   losartan 25 MG tablet Commonly known as:  COZAAR Take 25 mg by mouth daily. per Dr. Clayborn Bigness   memantine 14 MG Cp24 24 hr capsule Commonly known as:  NAMENDA XR Take 14 mg by mouth daily. Take with meals   NITROSTAT 0.4 MG SL tablet Generic drug:  nitroGLYCERIN Place 1 tablet under the tongue every 5 (five) minutes as needed for chest pain.   traZODone 50 MG tablet Commonly known as:  DESYREL Take 25 mg by mouth 2 (two) times daily. 1/2 tab   Vitamin D3 2000 units capsule Take 2,000 Units by mouth daily.       Review of Systems  Unable to perform ROS: Dementia  Constitutional: Negative for activity change, appetite change, chills, diaphoresis and fever.  HENT: Negative for congestion, mouth sores, nosebleeds, postnasal drip, sneezing, sore throat, trouble swallowing and voice change.   Respiratory: Negative for apnea, cough, choking, chest tightness, shortness of breath and wheezing.   Cardiovascular: Negative for chest pain, palpitations and leg swelling.  Gastrointestinal:  Negative for abdominal distention, abdominal pain, constipation, diarrhea and nausea.  Genitourinary: Negative for difficulty urinating, dysuria, frequency and urgency.  Musculoskeletal: Positive for arthralgias (typical arthritis). Negative for back pain, gait problem and myalgias.  Skin: Negative for color change, pallor, rash and wound.  Neurological: Negative for dizziness, tremors, syncope, speech difficulty, weakness, numbness and headaches.  Psychiatric/Behavioral: Positive for confusion and dysphoric mood. Negative for agitation and behavioral problems.  All other systems reviewed and are  negative.   Immunization History  Administered Date(s) Administered  . Influenza Whole 07/10/2013  . Influenza-Unspecified 06/05/2017  . PPD Test 10/14/2016  . Pneumococcal Polysaccharide-23 08/31/2006   Pertinent  Health Maintenance Due  Topic Date Due  . DEXA SCAN  06/06/1998  . PNA vac Low Risk Adult (2 of 2 - PCV13) 09/01/2007  . INFLUENZA VACCINE  Completed   No flowsheet data found. Functional Status Survey:    Vitals:   11/28/17 0909  BP: (!) 150/68  Pulse: 71  Resp: 20  Temp: 97.6 F (36.4 C)  TempSrc: Oral  SpO2: 97%  Weight: 165 lb 9.6 oz (75.1 kg)  Height: '5\' 3"'  (1.6 m)   Body mass index is 29.33 kg/m. Physical Exam  Constitutional: Vital signs are normal. She appears well-developed and well-nourished. She is active and cooperative. She does not appear ill. No distress.  HENT:  Head: Normocephalic and atraumatic.  Mouth/Throat: Uvula is midline, oropharynx is clear and moist and mucous membranes are normal. Mucous membranes are not pale, not dry and not cyanotic.  Eyes: Pupils are equal, round, and reactive to light. Conjunctivae, EOM and lids are normal.  Neck: Trachea normal, normal range of motion and full passive range of motion without pain. Neck supple. No JVD present. No tracheal deviation, no edema and no erythema present. No thyromegaly present.    Cardiovascular: Normal rate, regular rhythm, normal heart sounds, intact distal pulses and normal pulses. Exam reveals no gallop, no distant heart sounds and no friction rub.  No murmur heard. Pulses:      Dorsalis pedis pulses are 2+ on the right side, and 2+ on the left side.  Minimal BLE edema  Pulmonary/Chest: Effort normal and breath sounds normal. No accessory muscle usage. No respiratory distress. She has no decreased breath sounds. She has no wheezes. She has no rhonchi. She has no rales. She exhibits no tenderness.  Abdominal: Soft. Normal appearance and bowel sounds are normal. She exhibits no distension and no ascites. There is no tenderness.  Musculoskeletal: Normal range of motion. She exhibits no edema or tenderness.  Expected osteoarthritis, stiffness; Bilateral Calves soft, supple. Negative Homan's Sign. B- pedal pulses equal  Neurological: She is alert. She has normal strength. She displays atrophy. Coordination and gait abnormal.  Skin: Skin is warm, dry and intact. She is not diaphoretic. No cyanosis. No pallor. Nails show no clubbing.  Psychiatric: Her speech is normal. Judgment and thought content normal. She is withdrawn. Cognition and memory are impaired. She exhibits a depressed mood. She exhibits abnormal recent memory.  Nursing note and vitals reviewed.   Labs reviewed: Recent Labs    05/31/17 0455  NA 140  K 4.3  CL 103  CO2 30  GLUCOSE 80  BUN 15  CREATININE 0.76  CALCIUM 8.8*  MG 2.5*   Recent Labs    05/31/17 0455  AST 22  ALT 12*  ALKPHOS 47  BILITOT 0.5  PROT 6.5  ALBUMIN 3.5   Recent Labs    05/31/17 0455  WBC 5.0  NEUTROABS 2.5  HGB 12.8  HCT 37.5  MCV 90.3  PLT 169   Lab Results  Component Value Date   TSH 1.484 05/31/2017   No results found for: HGBA1C Lab Results  Component Value Date   CHOL 208 (H) 05/31/2017   HDL 52 05/31/2017   LDLCALC 126 (H) 05/31/2017   TRIG 151 (H) 05/31/2017   CHOLHDL 4.0 05/31/2017     Significant Diagnostic Results in last  30 days:  No results found.  Assessment/Plan Yarixa was seen today for medical management of chronic issues.  Diagnoses and all orders for this visit:  Anxiety disorder, unspecified type  Fibromyalgia  Hyperlipidemia, unspecified hyperlipidemia type   above listed conditions stable  Continue current medication regimen  Continue to encourage pt participation in activities and interactions with other residents  Monitor for worsening pain  Monitor for worsening anxiety  Labs   Family/ staff Communication:   Total Time:  Documentation:  Face to Face:  Family/Phone:   Labs/tests ordered:  Cbc, met c, mag+, TSH, B12, D, lipid panel, free depakote level  Medication list reviewed and assessed for continued appropriateness. Monthly medication orders reviewed and signed.  Vikki Ports, NP-C Geriatrics [facility] [phone], [place] [address] Cell Phone (Mon-Fri 8am-5pm):  [phone] On Call:  [phone] & follow prompts after 5pm & weekends Office Phone:  [phone] Office Fax:  [phone]

## 2017-12-03 NOTE — Assessment & Plan Note (Signed)
Stable. Pt denies pain. Symptoms controlled with Lexapro and scheduled Tylenol 650 mg po BID.

## 2017-12-03 NOTE — Assessment & Plan Note (Signed)
Mild. Pt has Lasix 20 mg po Q Day prn. Add TED Hose to the regimen daily. Elevate feet when at rest. Pt denies chest pain or shortness of breath, no cough

## 2017-12-03 NOTE — Assessment & Plan Note (Signed)
Stable. Big improvement over the past few months. Pt continues on Lexapro 10 mg po Q Day with the addition of Depakote 375 mg po BID. No recent exacerbations of symptoms

## 2017-12-04 ENCOUNTER — Other Ambulatory Visit
Admission: RE | Admit: 2017-12-04 | Discharge: 2017-12-04 | Disposition: A | Discharge status: Home / Self Care | Payer: Medicare HMO | Source: Ambulatory Visit | Attending: Gerontology | Admitting: Gerontology

## 2017-12-04 DIAGNOSIS — G301 Alzheimer's disease with late onset: Secondary | ICD-10-CM | POA: Diagnosis present

## 2017-12-04 DIAGNOSIS — F0280 Dementia in other diseases classified elsewhere without behavioral disturbance: Secondary | ICD-10-CM | POA: Diagnosis present

## 2017-12-04 LAB — LIPID PANEL
Cholesterol: 236 mg/dL — ABNORMAL HIGH (ref 0–200)
HDL: 50 mg/dL (ref >40)
LDL CALC: 127 mg/dL — AB (ref 0–99)
Total CHOL/HDL Ratio: 4.7 RATIO
Triglycerides: 297 mg/dL — ABNORMAL HIGH (ref <150)
VLDL: 59 mg/dL — AB (ref 0–40)

## 2017-12-04 LAB — CBC WITH DIFFERENTIAL/PLATELET
BASOS ABS: 0 10*3/uL (ref 0–0.1)
Basophils Relative: 1 %
EOS ABS: 0.2 10*3/uL (ref 0–0.7)
EOS PCT: 4 %
HCT: 40 % (ref 35.0–47.0)
Hemoglobin: 13.2 g/dL (ref 12.0–16.0)
LYMPHS PCT: 25 %
Lymphs Abs: 1.4 10*3/uL (ref 1.0–3.6)
MCH: 30.3 pg (ref 26.0–34.0)
MCHC: 32.9 g/dL (ref 32.0–36.0)
MCV: 91.9 fL (ref 80.0–100.0)
Monocytes Absolute: 0.6 10*3/uL (ref 0.2–0.9)
Monocytes Relative: 10 %
Neutro Abs: 3.4 10*3/uL (ref 1.4–6.5)
Neutrophils Relative %: 60 %
PLATELETS: 194 10*3/uL (ref 150–440)
RBC: 4.35 MIL/uL (ref 3.80–5.20)
RDW: 14.1 % (ref 11.5–14.5)
WBC: 5.7 10*3/uL (ref 3.6–11.0)

## 2017-12-04 LAB — COMPREHENSIVE METABOLIC PANEL
ALT: 18 U/L (ref 14–54)
AST: 24 U/L (ref 15–41)
Albumin: 3.6 g/dL (ref 3.5–5.0)
Alkaline Phosphatase: 58 U/L (ref 38–126)
Anion gap: 11 (ref 5–15)
BUN: 21 mg/dL — AB (ref 6–20)
CHLORIDE: 101 mmol/L (ref 101–111)
CO2: 28 mmol/L (ref 22–32)
CREATININE: 0.91 mg/dL (ref 0.44–1.00)
Calcium: 8.9 mg/dL (ref 8.9–10.3)
GFR calc Af Amer: >60 mL/min (ref >60)
GFR calc non Af Amer: 56 mL/min — ABNORMAL LOW (ref >60)
Glucose, Bld: 103 mg/dL — ABNORMAL HIGH (ref 65–99)
Potassium: 3.9 mmol/L (ref 3.5–5.1)
SODIUM: 140 mmol/L (ref 135–145)
Total Bilirubin: 0.4 mg/dL (ref 0.3–1.2)
Total Protein: 6.8 g/dL (ref 6.5–8.1)

## 2017-12-04 LAB — MAGNESIUM: Magnesium: 2.4 mg/dL (ref 1.7–2.4)

## 2017-12-04 LAB — VITAMIN B12: VITAMIN B 12: 960 pg/mL — AB (ref 180–914)

## 2017-12-04 LAB — TSH: TSH: 0.719 u[IU]/mL (ref 0.350–4.500)

## 2017-12-05 LAB — VITAMIN D 25 HYDROXY (VIT D DEFICIENCY, FRACTURES): Vit D, 25-Hydroxy: 45 ng/mL (ref 30.0–100.0)

## 2017-12-10 ENCOUNTER — Encounter
Admission: RE | Admit: 2017-12-10 | Discharge: 2017-12-10 | Disposition: A | Discharge status: Home / Self Care | Payer: Private Health Insurance - Indemnity | Source: Ambulatory Visit | Attending: Internal Medicine | Admitting: Internal Medicine

## 2017-12-27 ENCOUNTER — Non-Acute Institutional Stay: Payer: Medicare HMO | Admitting: Gerontology

## 2017-12-27 ENCOUNTER — Encounter: Payer: Self-pay | Admitting: Gerontology

## 2017-12-27 DIAGNOSIS — I252 Old myocardial infarction: Secondary | ICD-10-CM

## 2017-12-27 DIAGNOSIS — I10 Essential (primary) hypertension: Secondary | ICD-10-CM

## 2017-12-27 DIAGNOSIS — I25119 Atherosclerotic heart disease of native coronary artery with unspecified angina pectoris: Secondary | ICD-10-CM | POA: Diagnosis not present

## 2017-12-27 NOTE — Progress Notes (Signed)
Location:    Nursing Home Room Number: 247 Place of Service:  ALF [phone]) Provider:  Lorenso Quarry, NP-C  [name], MD  Patient Care Team: [name], MD as PCP - General (Internal Medicine)  Extended Emergency Contact Information Primary Emergency Contact: Luddy,[name] Address: [address]          [place], [place] [address] Darden Amber of [place] Home Phone: [phone] Mobile Phone: [phone] Relation: Spouse Secondary Emergency Contact: Lelon Perla [place] of [place] Home Phone: [phone] Work Phone: [phone] Mobile Phone: [phone] Relation: Niece  Code Status:  FULL Goals of care: Advanced Directive information Advanced Directives 12/27/2017  Does Patient Have a Medical Advance Directive? No  Type of Advance Directive -  Does patient want to make changes to medical advance directive? -  Copy of Healthcare Power of Attorney in Chart? -     Chief Complaint  Patient presents with  . Medical Management of Chronic Issues    Routine Visit    HPI:  Pt is a 84 y.o. female seen today for medical management of chronic diseases.    Old myocardial infarction Stable. No recent c/o chest pain or shortness of breath. On ASA 81 mg po Q Day and has prn NitroStat  Atherosclerosis of coronary artery Stable. No chest pain or shortness of breath. On ASA 81 mg po Q Day and prn NitroStat.  Essential hypertension Stable. On Cozaar 25 mg po Q Day. Pt is followed by Cardiology.   Please note pt with limited verbal/cognitive ability. Unable to obtain complete ROS. Some ROS info obtained from staff and documentation.   Past Medical History:  Diagnosis Date  . Allergy   . Anxiety   . Arthritis   . Asthma   . Baker's cyst    R knee  . Chest pain, unspecified 07/28/2013  . Coronary artery disease 07/28/2013  . Coronary heart disease   . Dementia   . Diverticulosis   . Encounter for Medicare annual wellness exam 09/29/2013    . Fibromyalgia   . Hematuria   . History of echocardiogram    EF > 55%, inf wall motion abnormalities, post MI  . Hyperlipidemia, unspecified    LDL 105 - Atorvostatin initiated 8/13, target LDL < 70  . Hypertension   . IBS (irritable bowel syndrome)   . Kidney stones   . Living will, counseling/discussion   . Mild dementia   . Myocardial infarct (HCC)    ST elevation MI  . Nephrolithiasis    [name]  . Osteoporosis   . Stented coronary artery    RCA 05/10/12, x2 DES mid and prox RCA  . UTI (lower urinary tract infection)    Past Surgical History:  Procedure Laterality Date  . ANGIOPLASTY    . APPENDECTOMY    . BREAST BIOPSY     benign nodule  . BREAST SURGERY     bxs x 5  . CARDIAC CATHETERIZATION  05/09/2012   [facility]  . CHOLECYSTECTOMY    . COLONOSCOPY  08/12/2008   Pt declined repeat in 2014  . CORONARY ANGIOPLASTY WITH STENT PLACEMENT    . CYSTOSCOPY    . LITHOTRIPSY    . TONSILLECTOMY    . TOTAL ABDOMINAL HYSTERECTOMY W/ BILATERAL SALPINGOOPHORECTOMY    . URETEROSCOPY     with stent placement    Allergies  Allergen Reactions  . Epinephrine Other (See Comments)    Rapid heart beat.  . Dtap-Hepatitis B Recomb-Ipv Other (See Comments)  .  Levofloxacin Swelling    Edema in knee Edema in knee  . Metronidazole Nausea Only    Nausea and edema in knee Nausea and edema in knee  . Other     Other reaction(s): Unknown Sympathomimetic agents   . Penicillins   . Tetanus Toxoids     Arm swelling  . Amoxicillin-Pot Clavulanate Rash    Allergies as of 12/27/2017      Reactions   Epinephrine Other (See Comments)   Rapid heart beat.   Dtap-hepatitis B Recomb-ipv Other (See Comments)   Levofloxacin Swelling   Edema in knee Edema in knee   Metronidazole Nausea Only   Nausea and edema in knee Nausea and edema in knee   Other    Other reaction(s): Unknown Sympathomimetic agents   Penicillins    Tetanus Toxoids    Arm swelling   Amoxicillin-pot Clavulanate  Rash      Medication List        Accurate as of 12/27/17 11:21 PM. Always use your most recent med list.          acetaminophen 325 MG tablet Commonly known as:  TYLENOL Take 650 mg by mouth 2 (two) times daily.   aspirin 81 MG chewable tablet Chew 81 mg by mouth daily. With meal   cyanocobalamin 1000 MCG tablet Take 1,000 mcg by mouth daily.   diclofenac sodium 1 % Gel Commonly known as:  VOLTAREN Apply 4 g topically 4 (four) times daily. right knee for pain   divalproex 125 MG capsule Commonly known as:  DEPAKOTE SPRINKLE Take 375 mg by mouth 2 (two) times daily. 3 caps   escitalopram 10 MG tablet Commonly known as:  LEXAPRO Take 10 mg by mouth daily.   furosemide 20 MG tablet Commonly known as:  LASIX Take 20 mg by mouth daily as needed. as needed for Edema per Dr. Juliann Pares   galantamine 8 MG tablet Commonly known as:  RAZADYNE Take 8 mg by mouth 2 (two) times daily. give one at 8 am and one at 5 pm   losartan 25 MG tablet Commonly known as:  COZAAR Take 25 mg by mouth daily. per Dr. Juliann Pares   memantine 14 MG Cp24 24 hr capsule Commonly known as:  NAMENDA XR Take 14 mg by mouth daily. Take with meals   NITROSTAT 0.4 MG SL tablet Generic drug:  nitroGLYCERIN Place 1 tablet under the tongue every 5 (five) minutes as needed for chest pain.   traZODone 50 MG tablet Commonly known as:  DESYREL Take 25 mg by mouth 2 (two) times daily. 1/2 tab   Vitamin D3 2000 units capsule Take 2,000 Units by mouth daily.       Review of Systems  Unable to perform ROS: Dementia  Constitutional: Negative for activity change, appetite change, chills, diaphoresis and fever.  HENT: Negative for congestion, mouth sores, nosebleeds, postnasal drip, sneezing, sore throat, trouble swallowing and voice change.   Respiratory: Negative for apnea, cough, choking, chest tightness, shortness of breath and wheezing.   Cardiovascular: Negative for chest pain, palpitations and leg  swelling.  Gastrointestinal: Negative for abdominal distention, abdominal pain, constipation, diarrhea and nausea.  Genitourinary: Negative for difficulty urinating, dysuria, frequency and urgency.  Musculoskeletal: Positive for arthralgias (typical arthritis). Negative for back pain, gait problem and myalgias.  Skin: Negative for color change, pallor, rash and wound.  Neurological: Negative for dizziness, tremors, syncope, speech difficulty, weakness, numbness and headaches.  Psychiatric/Behavioral: Negative for agitation and behavioral problems. The patient  is nervous/anxious.   All other systems reviewed and are negative.   Immunization History  Administered Date(s) Administered  . Influenza Whole 07/10/2013  . Influenza-Unspecified 06/05/2017  . PPD Test 10/14/2016  . Pneumococcal Polysaccharide-23 08/31/2006   Pertinent  Health Maintenance Due  Topic Date Due  . DEXA SCAN  06/06/1998  . PNA vac Low Risk Adult (2 of 2 - PCV13) 09/01/2007  . INFLUENZA VACCINE  04/11/2018   No flowsheet data found. Functional Status Survey:    Vitals:   12/27/17 1047  Weight: 169 lb 11.2 oz (77 kg)  Height: 5\' 3"  (1.6 m)   Body mass index is 30.06 kg/m. Physical Exam  Constitutional: Vital signs are normal. She appears well-developed and well-nourished. She is active and cooperative. She does not appear ill. No distress.  HENT:  Head: Normocephalic and atraumatic.  Mouth/Throat: Uvula is midline, oropharynx is clear and moist and mucous membranes are normal. Mucous membranes are not pale, not dry and not cyanotic.  Eyes: Pupils are equal, round, and reactive to light. Conjunctivae, EOM and lids are normal.  Neck: Trachea normal, normal range of motion and full passive range of motion without pain. Neck supple. No JVD present. No tracheal deviation, no edema and no erythema present. No thyromegaly present.  Cardiovascular: Normal rate, regular rhythm, normal heart sounds, intact distal  pulses and normal pulses. Exam reveals no gallop, no distant heart sounds and no friction rub.  No murmur heard. Pulses:      Dorsalis pedis pulses are 2+ on the right side, and 2+ on the left side.  No edema  Pulmonary/Chest: Effort normal and breath sounds normal. No accessory muscle usage. No respiratory distress. She has no decreased breath sounds. She has no wheezes. She has no rhonchi. She has no rales. She exhibits no tenderness.  Abdominal: Soft. Normal appearance and bowel sounds are normal. She exhibits no distension and no ascites. There is no tenderness.  Musculoskeletal: Normal range of motion. She exhibits no edema or tenderness.  Expected osteoarthritis, stiffness; Bilateral Calves soft, supple. Negative Homan's Sign. B- pedal pulses equal; mildly unstable gait, independently mobile  Neurological: She is alert. She has normal strength. Gait abnormal.  Skin: Skin is warm, dry and intact. She is not diaphoretic. No cyanosis. No pallor. Nails show no clubbing.  Psychiatric: Judgment and thought content normal. Her mood appears anxious. Her speech is delayed. She is slowed. Cognition and memory are impaired. She exhibits abnormal recent memory and abnormal remote memory.  Nursing note and vitals reviewed.   Labs reviewed: Recent Labs    05/31/17 0455 12/04/17 1647  NA 140 140  K 4.3 3.9  CL 103 101  CO2 30 28  GLUCOSE 80 103*  BUN 15 21*  CREATININE 0.76 0.91  CALCIUM 8.8* 8.9  MG 2.5* 2.4   Recent Labs    05/31/17 0455 12/04/17 1647  AST 22 24  ALT 12* 18  ALKPHOS 47 58  BILITOT 0.5 0.4  PROT 6.5 6.8  ALBUMIN 3.5 3.6   Recent Labs    05/31/17 0455 12/04/17 1647  WBC 5.0 5.7  NEUTROABS 2.5 3.4  HGB 12.8 13.2  HCT 37.5 40.0  MCV 90.3 91.9  PLT 169 194   Lab Results  Component Value Date   TSH 0.719 12/04/2017   No results found for: HGBA1C Lab Results  Component Value Date   CHOL 236 (H) 12/04/2017   HDL 50 12/04/2017   LDLCALC 127 (H) 12/04/2017    TRIG  297 (H) 12/04/2017   CHOLHDL 4.7 12/04/2017    Significant Diagnostic Results in last 30 days:  No results found.  Assessment/Plan Ahmyah was seen today for medical management of chronic issues.  Diagnoses and all orders for this visit:  Old myocardial infarction  Atherosclerosis of native coronary artery of native heart with angina pectoris (HCC)  Essential hypertension   Above listed conditions stable  Continue current medication regimen  Continue to encourage pt interaction with other residents and participation in activities  Safety precautions  Family/ staff Communication:   Total Time:  Documentation:  Face to Face:  Family/Phone:   Labs/tests ordered:  Not due, recent labs reviewed  Medication list reviewed and assessed for continued appropriateness. Monthly medication orders reviewed and signed.  Brynda Rim, NP-C Geriatrics [facility] [phone] [address] [place], [place] [address] Cell Phone (Mon-Fri 8am-5pm):  [phone] On Call:  [phone] & follow prompts after 5pm & weekends Office Phone:  [phone] Office Fax:  [phone]

## 2017-12-27 NOTE — Assessment & Plan Note (Signed)
Stable. On Cozaar 25 mg po Q Day. Pt is followed by Cardiology.

## 2017-12-27 NOTE — Assessment & Plan Note (Signed)
Stable. No recent c/o chest pain or shortness of breath. On ASA 81 mg po Q Day and has prn NitroStat

## 2017-12-27 NOTE — Assessment & Plan Note (Signed)
Stable. No chest pain or shortness of breath. On ASA 81 mg po Q Day and prn NitroStat.

## 2018-01-09 ENCOUNTER — Encounter
Admission: RE | Admit: 2018-01-09 | Discharge: 2018-01-09 | Disposition: A | Discharge status: Home / Self Care | Payer: Private Health Insurance - Indemnity | Source: Ambulatory Visit | Attending: Internal Medicine | Admitting: Internal Medicine

## 2018-01-10 ENCOUNTER — Other Ambulatory Visit: Payer: Self-pay | Admitting: Podiatry

## 2018-01-10 DIAGNOSIS — M722 Plantar fascial fibromatosis: Secondary | ICD-10-CM

## 2018-01-29 ENCOUNTER — Encounter: Payer: Self-pay | Admitting: Gerontology

## 2018-01-29 ENCOUNTER — Non-Acute Institutional Stay: Payer: Medicare HMO | Admitting: Gerontology

## 2018-01-29 DIAGNOSIS — H47019 Ischemic optic neuropathy, unspecified eye: Secondary | ICD-10-CM

## 2018-01-29 DIAGNOSIS — M8580 Other specified disorders of bone density and structure, unspecified site: Secondary | ICD-10-CM | POA: Diagnosis not present

## 2018-01-29 DIAGNOSIS — G301 Alzheimer's disease with late onset: Secondary | ICD-10-CM | POA: Diagnosis not present

## 2018-01-29 DIAGNOSIS — F0281 Dementia in other diseases classified elsewhere with behavioral disturbance: Secondary | ICD-10-CM

## 2018-01-29 DIAGNOSIS — F02818 Dementia in other diseases classified elsewhere, unspecified severity, with other behavioral disturbance: Secondary | ICD-10-CM

## 2018-02-03 NOTE — Assessment & Plan Note (Signed)
Stable. Very slowly progressive. Resides on Memory Care unit. Continues on galantamine 8 mg po BID and Nemanda XR 14 mg daily. No negative behaviors

## 2018-02-03 NOTE — Assessment & Plan Note (Signed)
Stable. No treatment at this time. Ophthamology as needed.

## 2018-02-03 NOTE — Progress Notes (Signed)
Location:    Nursing Home Room Number: 247P Place of Service:  ALF [phone]) Provider:  Lorenso Quarry, NP-C  [name], MD  Patient Care Team: [name], MD as PCP - General (Internal Medicine)  Extended Emergency Contact Information Primary Emergency Contact: Mccombs,[name] Address: [address]          [place], [place] [address] D[place] of [place] Home Phone: [phone] Mobile Phone: [phone] Relation: Spouse Secondary Emergency Contact: Lelon Perla [place] of [place] Home Phone: [phone] Work Phone: [phone] Mobile Phone: [phone] Relation: Niece  Code Status:  DNR Goals of care: Advanced Directive information Advanced Directives 01/29/2018  Does Patient Have a Medical Advance Directive? No  Type of Advance Directive -  Does patient want to make changes to medical advance directive? -  Copy of Healthcare Power of Attorney in Chart? -     Chief Complaint  Patient presents with  . Medical Management of Chronic Issues    Routine Visit    HPI:  Pt is a 84 y.o. female seen today for medical management of chronic diseases.    ION (ischemic optic neuropathy) Stable. No treatment at this time. Ophthamology as needed.  Alzheimer's dementia, late onset Stable. Very slowly progressive. Resides on Memory Care unit. Continues on galantamine 8 mg po BID and Nemanda XR 14 mg daily. No negative behaviors  Osteopenia Stable. On Cholecalciferol 2,000 units daily. No recent falls or fractures  Please note pt with limited verbal/cognitive ability. Unable to obtain complete ROS. Some ROS info obtained from staff and documentation.   Past Medical History:  Diagnosis Date  . Allergy   . Anxiety   . Arthritis   . Asthma   . Baker's cyst    R knee  . Chest pain, unspecified 07/28/2013  . Coronary artery disease 07/28/2013  . Coronary heart disease   . Dementia   . Diverticulosis   . Encounter for Medicare annual  wellness exam 09/29/2013  . Fibromyalgia   . Hematuria   . History of echocardiogram    EF > 55%, inf wall motion abnormalities, post MI  . Hyperlipidemia, unspecified    LDL 105 - Atorvostatin initiated 8/13, target LDL < 70  . Hypertension   . IBS (irritable bowel syndrome)   . Kidney stones   . Living will, counseling/discussion   . Mild dementia   . Myocardial infarct (HCC)    ST elevation MI  . Nephrolithiasis    [name]  . Osteoporosis   . Stented coronary artery    RCA 05/10/12, x2 DES mid and prox RCA  . UTI (lower urinary tract infection)    Past Surgical History:  Procedure Laterality Date  . ANGIOPLASTY    . APPENDECTOMY    . BREAST BIOPSY     benign nodule  . BREAST SURGERY     bxs x 5  . CARDIAC CATHETERIZATION  05/09/2012   [facility]  . CHOLECYSTECTOMY    . COLONOSCOPY  08/12/2008   Pt declined repeat in 2014  . CORONARY ANGIOPLASTY WITH STENT PLACEMENT    . CYSTOSCOPY    . LITHOTRIPSY    . TONSILLECTOMY    . TOTAL ABDOMINAL HYSTERECTOMY W/ BILATERAL SALPINGOOPHORECTOMY    . URETEROSCOPY     with stent placement    Allergies  Allergen Reactions  . Epinephrine Other (See Comments)    Rapid heart beat.  . Dtap-Hepatitis B Recomb-Ipv Other (See Comments)  . Levofloxacin Swelling    Edema in  knee Edema in knee  . Metronidazole Nausea Only    Nausea and edema in knee Nausea and edema in knee  . Other     Other reaction(s): Unknown Sympathomimetic agents   . Penicillins   . Tetanus Toxoids     Arm swelling  . Amoxicillin-Pot Clavulanate Rash    Allergies as of 01/29/2018      Reactions   Epinephrine Other (See Comments)   Rapid heart beat.   Dtap-hepatitis B Recomb-ipv Other (See Comments)   Levofloxacin Swelling   Edema in knee Edema in knee   Metronidazole Nausea Only   Nausea and edema in knee Nausea and edema in knee   Other    Other reaction(s): Unknown Sympathomimetic agents   Penicillins    Tetanus Toxoids    Arm swelling    Amoxicillin-pot Clavulanate Rash      Medication List        Accurate as of 01/29/18 11:59 PM. Always use your most recent med list.          acetaminophen 325 MG tablet Commonly known as:  TYLENOL Take 650 mg by mouth 2 (two) times daily.   aspirin 81 MG chewable tablet Chew 81 mg by mouth daily. With meal   cyanocobalamin 1000 MCG tablet Take 1,000 mcg by mouth daily.   diclofenac sodium 1 % Gel Commonly known as:  VOLTAREN Apply 4 g topically 4 (four) times daily. right knee for pain   divalproex 125 MG capsule Commonly known as:  DEPAKOTE SPRINKLE Take 375 mg by mouth 2 (two) times daily. 3 caps   escitalopram 10 MG tablet Commonly known as:  LEXAPRO Take 10 mg by mouth daily.   furosemide 20 MG tablet Commonly known as:  LASIX Take 20 mg by mouth daily as needed. as needed for Edema per Dr. Juliann Pares   galantamine 8 MG tablet Commonly known as:  RAZADYNE Take 8 mg by mouth 2 (two) times daily. give one at 8 am and one at 5 pm   losartan 25 MG tablet Commonly known as:  COZAAR Take 25 mg by mouth daily. per Dr. Juliann Pares   memantine 14 MG Cp24 24 hr capsule Commonly known as:  NAMENDA XR Take 14 mg by mouth daily. Take with meals   NITROSTAT 0.4 MG SL tablet Generic drug:  nitroGLYCERIN Place 1 tablet under the tongue every 5 (five) minutes as needed for chest pain.   traZODone 50 MG tablet Commonly known as:  DESYREL Take 25 mg by mouth 2 (two) times daily. 1/2 tab   Vitamin D3 2000 units capsule Take 2,000 Units by mouth daily.       Review of Systems  Unable to perform ROS: Dementia  Constitutional: Negative for activity change, appetite change, chills, diaphoresis and fever.  HENT: Negative for congestion, mouth sores, nosebleeds, postnasal drip, sneezing, sore throat, trouble swallowing and voice change.   Respiratory: Negative for apnea, cough, choking, chest tightness, shortness of breath and wheezing.   Cardiovascular: Positive for leg  swelling (mild). Negative for chest pain and palpitations.  Gastrointestinal: Negative for abdominal distention, abdominal pain, constipation, diarrhea and nausea.  Genitourinary: Negative for difficulty urinating, dysuria, frequency and urgency.  Musculoskeletal: Negative for back pain, gait problem and myalgias. Arthralgias: typical arthritis.  Skin: Negative for color change, pallor, rash and wound.  Neurological: Negative for dizziness, tremors, syncope, speech difficulty, weakness, numbness and headaches.  Psychiatric/Behavioral: Negative for agitation and behavioral problems.  All other systems reviewed and are negative.  Immunization History  Administered Date(s) Administered  . Influenza Whole 07/10/2013  . Influenza-Unspecified 06/05/2017  . PPD Test 10/14/2016  . Pneumococcal Polysaccharide-23 08/31/2006   Pertinent  Health Maintenance Due  Topic Date Due  . DEXA SCAN  06/06/1998  . PNA vac Low Risk Adult (2 of 2 - PCV13) 09/01/2007  . INFLUENZA VACCINE  04/11/2018   No flowsheet data found. Functional Status Survey:    Vitals:   01/29/18 1354  BP: 122/72  Pulse: 72  Resp: 16  Temp: (!) 96.6 F (35.9 C)  TempSrc: Oral  SpO2: 96%  Weight: 169 lb (76.7 kg)  Height:  (1.6 m)   Body mass index is 29.94 kg/m. Physical Exam  Constitutional: Vital signs are normal. She appears well-developed and well-nourished. She is active and cooperative. She does not appear ill. No distress.  HENT:  Head: Normocephalic and atraumatic.  Mouth/Throat: Uvula is midline, oropharynx is clear and moist and mucous membranes are normal. Mucous membranes are not pale, not dry and not cyanotic.  Eyes: Pupils are equal, round, and reactive to light. Conjunctivae, EOM and lids are normal.  Neck: Trachea normal, normal range of motion and full passive range of motion without pain. Neck supple. No JVD present. No tracheal deviation, no edema and no erythema present. No thyromegaly  present.  Cardiovascular: Normal rate, regular rhythm, normal heart sounds, intact distal pulses and normal pulses. Exam reveals no gallop, no distant heart sounds and no friction rub.  No murmur heard. Pulses:      Dorsalis pedis pulses are 2+ on the right side, and 2+ on the left side.  No edema  Pulmonary/Chest: Effort normal and breath sounds normal. No accessory muscle usage. No respiratory distress. She has no decreased breath sounds. She has no wheezes. She has no rhonchi. She has no rales. She exhibits no tenderness.  Abdominal: Soft. Normal appearance and bowel sounds are normal. She exhibits no distension and no ascites. There is no tenderness.  Musculoskeletal: Normal range of motion. She exhibits no edema or tenderness.  Expected osteoarthritis, stiffness; Bilateral Calves soft, supple. Negative Homan's Sign. B- pedal pulses equal  Neurological: She is alert. She has normal strength.  Skin: Skin is warm, dry and intact. She is not diaphoretic. No cyanosis. No pallor. Nails show no clubbing.  Psychiatric: She has a normal mood and affect. Her speech is normal and behavior is normal. Judgment and thought content normal. Cognition and memory are impaired. She exhibits abnormal recent memory.  Nursing note and vitals reviewed.   Labs reviewed: Recent Labs    05/31/17 0455 12/04/17 1647  NA 140 140  K 4.3 3.9  CL 103 101  CO2 30 28  GLUCOSE 80 103*  BUN 15 21*  CREATININE 0.76 0.91  CALCIUM 8.8* 8.9  MG 2.5* 2.4   Recent Labs    05/31/17 0455 12/04/17 1647  AST 22 24  ALT 12* 18  ALKPHOS 47 58  BILITOT 0.5 0.4  PROT 6.5 6.8  ALBUMIN 3.5 3.6   Recent Labs    05/31/17 0455 12/04/17 1647  WBC 5.0 5.7  NEUTROABS 2.5 3.4  HGB 12.8 13.2  HCT 37.5 40.0  MCV 90.3 91.9  PLT 169 194   Lab Results  Component Value Date   TSH 0.719 12/04/2017   No results found for: HGBA1C Lab Results  Component Value Date   CHOL 236 (H) 12/04/2017   HDL 50 12/04/2017    LDLCALC 127 (H) 12/04/2017   TRIG  297 (H) 12/04/2017   CHOLHDL 4.7 12/04/2017    Significant Diagnostic Results in last 30 days:  Dg Foot Complete Right  Result Date: 01/10/2018 Please see detailed radiograph report in office note.   Assessment/Plan Shaunee was seen today for medical management of chronic issues.  Diagnoses and all orders for this visit:  Ischemic optic neuropathy, unspecified laterality  Late onset Alzheimer's disease with behavioral disturbance  Osteopenia, unspecified location   Above listed conditions stable  Continue current medication regimen  Safety precautions  Fall precautions  Continue to encourage interaction with other residents and participation in activities  Monitor for worsening vision  Family/ staff Communication:   Total Time:  Documentation:  Face to Face:  Family/Phone:   Labs/tests ordered:  Not due  Medication list reviewed and assessed for continued appropriateness. Monthly medication orders reviewed and signed.  Brynda Rim, NP-C Geriatrics [facility] [phone] [address] [place], [place] [address] Cell Phone (Mon-Fri 8am-5pm):  [phone] On Call:  [phone] & follow prompts after 5pm & weekends Office Phone:  [phone] Office Fax:  [phone]

## 2018-02-03 NOTE — Assessment & Plan Note (Signed)
Stable. On Cholecalciferol 2,000 units daily. No recent falls or fractures

## 2018-02-09 ENCOUNTER — Encounter
Admission: RE | Admit: 2018-02-09 | Discharge: 2018-02-09 | Disposition: A | Discharge status: Home / Self Care | Payer: Private Health Insurance - Indemnity | Source: Ambulatory Visit | Attending: Internal Medicine | Admitting: Internal Medicine

## 2018-03-05 ENCOUNTER — Encounter: Payer: Self-pay | Admitting: Gerontology

## 2018-03-05 ENCOUNTER — Non-Acute Institutional Stay: Payer: Medicare HMO | Admitting: Gerontology

## 2018-03-05 DIAGNOSIS — F419 Anxiety disorder, unspecified: Secondary | ICD-10-CM

## 2018-03-05 DIAGNOSIS — M797 Fibromyalgia: Secondary | ICD-10-CM

## 2018-03-05 DIAGNOSIS — M1990 Unspecified osteoarthritis, unspecified site: Secondary | ICD-10-CM | POA: Diagnosis not present

## 2018-03-11 ENCOUNTER — Encounter
Admission: RE | Admit: 2018-03-11 | Discharge: 2018-03-11 | Disposition: A | Discharge status: Home / Self Care | Payer: Private Health Insurance - Indemnity | Source: Ambulatory Visit | Attending: Internal Medicine | Admitting: Internal Medicine

## 2018-03-11 NOTE — Assessment & Plan Note (Signed)
Stable. No recent c/o pain. On APAP 650 mg po BID

## 2018-03-11 NOTE — Progress Notes (Signed)
Location:    Nursing Home Room Number: 247P Place of Service:  ALF [id number]) Provider:  Lorenso Quarry, NP-C  [name], MD  Patient Care Team: [name], MD as PCP - General (Internal Medicine)  Extended Emergency Contact Information Primary Emergency Contact: Raval,[name] Address: [address]          [place], [place] [address] [place] of [place] Home Phone: [phone] Mobile Phone: [phone] Relation: Spouse Secondary Emergency Contact: Lelon Perla [place] Home Phone: [phone] Work Phone: [phone] Mobile Phone: [phone] Relation: Niece  Code Status:  DNR Goals of care: Advanced Directive information Advanced Directives 03/05/2018  Does Patient Have a Medical Advance Directive? No  Type of Advance Directive -  Does patient want to make changes to medical advance directive? -  Copy of Healthcare Power of Attorney in Chart? -     Chief Complaint  Patient presents with  . Medical Management of Chronic Issues    Routine Visit    HPI:  Pt is a 84 y.o. female seen today for medical management of chronic diseases.    Osteoarthritis Stable. No recent c/o pain. On APAP 650 mg po BID  Anxiety disorder Stable. No reports of recent exacerbations. On Lexapro 10 mg po Q Day, Depakote sprinkles 375 mg po BID  Fibromyalgia Stable. No c/o worsening pain or myalgias. On APAP 650 mg po BID, Lexapro 10 mg po Q Day, Voltaren gel 1% 4 grams QID to knees.     Past Medical History:  Diagnosis Date  . Allergy   . Anxiety   . Arthritis   . Asthma   . Baker's cyst    R knee  . Chest pain, unspecified 07/28/2013  . Coronary artery disease 07/28/2013  . Coronary heart disease   . Dementia   . Diverticulosis   . Encounter for Medicare annual wellness exam 09/29/2013  . Fibromyalgia   . Hematuria   . History of echocardiogram    EF > 55%, inf wall motion abnormalities, post MI  . Hyperlipidemia, unspecified     LDL 105 - Atorvostatin initiated 8/13, target LDL < 70  . Hypertension   . IBS (irritable bowel syndrome)   . Kidney stones   . Living will, counseling/discussion   . Mild dementia   . Myocardial infarct (HCC)    ST elevation MI  . Nephrolithiasis    [name]  . Osteoporosis   . Stented coronary artery    RCA 05/10/12, x2 DES mid and prox RCA  . UTI (lower urinary tract infection)    Past Surgical History:  Procedure Laterality Date  . ANGIOPLASTY    . APPENDECTOMY    . BREAST BIOPSY     benign nodule  . BREAST SURGERY     bxs x 5  . CARDIAC CATHETERIZATION  05/09/2012   [facility]  . CHOLECYSTECTOMY    . COLONOSCOPY  08/12/2008   Pt declined repeat in 2014  . CORONARY ANGIOPLASTY WITH STENT PLACEMENT    . CYSTOSCOPY    . LITHOTRIPSY    . TONSILLECTOMY    . TOTAL ABDOMINAL HYSTERECTOMY W/ BILATERAL SALPINGOOPHORECTOMY    . URETEROSCOPY     with stent placement    Allergies  Allergen Reactions  . Epinephrine Other (See Comments)    Rapid heart beat.  . Dtap-Hepatitis B Recomb-Ipv Other (See Comments)  . Levofloxacin Swelling    Edema in knee Edema in knee  . Metronidazole Nausea Only  Nausea and edema in knee Nausea and edema in knee  . Other     Other reaction(s): Unknown Sympathomimetic agents   . Penicillins   . Tetanus Toxoids     Arm swelling  . Amoxicillin-Pot Clavulanate Rash    Allergies as of 03/05/2018      Reactions   Epinephrine Other (See Comments)   Rapid heart beat.   Dtap-hepatitis B Recomb-ipv Other (See Comments)   Levofloxacin Swelling   Edema in knee Edema in knee   Metronidazole Nausea Only   Nausea and edema in knee Nausea and edema in knee   Other    Other reaction(s): Unknown Sympathomimetic agents   Penicillins    Tetanus Toxoids    Arm swelling   Amoxicillin-pot Clavulanate Rash      Medication List        Accurate as of 03/05/18 11:59 PM. Always use your most recent med list.          acetaminophen 325 MG  tablet Commonly known as:  TYLENOL Take 650 mg by mouth 2 (two) times daily.   aspirin 81 MG chewable tablet Chew 81 mg by mouth daily. With meal   cyanocobalamin 1000 MCG tablet Take 1,000 mcg by mouth daily.   diclofenac sodium 1 % Gel Commonly known as:  VOLTAREN Apply 4 g topically 4 (four) times daily. right knee for pain   divalproex 125 MG capsule Commonly known as:  DEPAKOTE SPRINKLE Take 375 mg by mouth 2 (two) times daily. 3 caps   escitalopram 10 MG tablet Commonly known as:  LEXAPRO Take 10 mg by mouth daily.   furosemide 20 MG tablet Commonly known as:  LASIX Take 20 mg by mouth daily as needed. as needed for Edema per Dr. Juliann Pares   galantamine 8 MG tablet Commonly known as:  RAZADYNE Take 8 mg by mouth 2 (two) times daily. give one at 8 am and one at 5 pm   losartan 25 MG tablet Commonly known as:  COZAAR Take 25 mg by mouth daily. per Dr. Juliann Pares   memantine 14 MG Cp24 24 hr capsule Commonly known as:  NAMENDA XR Take 14 mg by mouth daily. Take with meals   NITROSTAT 0.4 MG SL tablet Generic drug:  nitroGLYCERIN Place 1 tablet under the tongue every 5 (five) minutes as needed for chest pain.   traZODone 50 MG tablet Commonly known as:  DESYREL Take 25 mg by mouth 2 (two) times daily. 1/2 tab   Vitamin D3 2000 units capsule Take 2,000 Units by mouth daily.       Review of Systems  Unable to perform ROS: Dementia  Constitutional: Negative for activity change, appetite change, chills, diaphoresis and fever.  HENT: Negative for congestion, mouth sores, nosebleeds, postnasal drip, sneezing, sore throat, trouble swallowing and voice change.   Respiratory: Negative for apnea, cough, choking, chest tightness, shortness of breath and wheezing.   Cardiovascular: Negative for chest pain, palpitations and leg swelling.  Gastrointestinal: Negative for abdominal distention, abdominal pain, constipation, diarrhea and nausea.  Genitourinary: Negative for  difficulty urinating, dysuria, frequency and urgency.  Musculoskeletal: Positive for arthralgias (typical arthritis). Negative for back pain, gait problem and myalgias.  Skin: Negative for color change, pallor, rash and wound.  Neurological: Negative for dizziness, tremors, syncope, speech difficulty, weakness, numbness and headaches.  Psychiatric/Behavioral: Negative for agitation and behavioral problems. The patient is nervous/anxious.   All other systems reviewed and are negative.   Immunization History  Administered Date(s) Administered  .  Influenza Whole 07/10/2013  . Influenza-Unspecified 06/05/2017  . PPD Test 10/14/2016  . Pneumococcal Polysaccharide-23 08/31/2006   Pertinent  Health Maintenance Due  Topic Date Due  . DEXA SCAN  06/06/1998  . PNA vac Low Risk Adult (2 of 2 - PCV13) 09/01/2007  . INFLUENZA VACCINE  04/11/2018   No flowsheet data found. Functional Status Survey:    Vitals:   03/05/18 0855  BP: 132/70  Pulse: 63  Resp: 16  Temp: (!) 97 F (36.1 C)  TempSrc: Oral  SpO2: 98%  Weight: 168 lb 14.4 oz (76.6 kg)  Height: 5\' 3"  (1.6 m)   Body mass index is 29.92 kg/m. Physical Exam  Constitutional: She is oriented to person, place, and time. Vital signs are normal. She appears well-developed and well-nourished. She is active and cooperative. She does not appear ill. No distress.  HENT:  Head: Normocephalic and atraumatic.  Mouth/Throat: Uvula is midline, oropharynx is clear and moist and mucous membranes are normal. Mucous membranes are not pale, not dry and not cyanotic.  Eyes: Pupils are equal, round, and reactive to light. Conjunctivae, EOM and lids are normal.  Neck: Trachea normal, normal range of motion and full passive range of motion without pain. Neck supple. No JVD present. No tracheal deviation, no edema and no erythema present. No thyromegaly present.  Cardiovascular: Normal rate, normal heart sounds, intact distal pulses and normal pulses. An  irregular rhythm present. Exam reveals no gallop, no distant heart sounds and no friction rub.  No murmur heard. Pulses:      Dorsalis pedis pulses are 2+ on the right side, and 2+ on the left side.  No edema  Pulmonary/Chest: Effort normal and breath sounds normal. No accessory muscle usage. No respiratory distress. She has no decreased breath sounds. She has no wheezes. She has no rhonchi. She has no rales. She exhibits no tenderness.  Abdominal: Soft. Normal appearance and bowel sounds are normal. She exhibits no distension and no ascites. There is no tenderness.  Musculoskeletal: Normal range of motion. She exhibits no edema or tenderness.  Expected osteoarthritis, stiffness; Bilateral Calves soft, supple. Negative Homan's Sign. B- pedal pulses equal  Neurological: She is alert and oriented to person, place, and time. She has normal strength.  Skin: Skin is warm, dry and intact. She is not diaphoretic. No cyanosis. No pallor. Nails show no clubbing.  Psychiatric: Her speech is normal and behavior is normal. Judgment and thought content normal. Her mood appears anxious. Cognition and memory are impaired. She exhibits abnormal recent memory.  Nursing note and vitals reviewed.   Labs reviewed: Recent Labs    05/31/17 0455 12/04/17 1647  NA 140 140  K 4.3 3.9  CL 103 101  CO2 30 28  GLUCOSE 80 103*  BUN 15 21*  CREATININE 0.76 0.91  CALCIUM 8.8* 8.9  MG 2.5* 2.4   Recent Labs    05/31/17 0455 12/04/17 1647  AST 22 24  ALT 12* 18  ALKPHOS 47 58  BILITOT 0.5 0.4  PROT 6.5 6.8  ALBUMIN 3.5 3.6   Recent Labs    05/31/17 0455 12/04/17 1647  WBC 5.0 5.7  NEUTROABS 2.5 3.4  HGB 12.8 13.2  HCT 37.5 40.0  MCV 90.3 91.9  PLT 169 194   Lab Results  Component Value Date   TSH 0.719 12/04/2017   No results found for: HGBA1C Lab Results  Component Value Date   CHOL 236 (H) 12/04/2017   HDL 50 12/04/2017  LDLCALC 127 (H) 12/04/2017   TRIG 297 (H) 12/04/2017   CHOLHDL  4.7 12/04/2017    Significant Diagnostic Results in last 30 days:  No results found.  Assessment/Plan Charrisse was seen today for medical management of chronic issues.  Diagnoses and all orders for this visit:  Osteoarthritis, unspecified osteoarthritis type, unspecified site  Anxiety disorder, unspecified type  Fibromyalgia   Above listed conditions stable  Continue current medication regimen  Monitor for worsening pain  Monitor for worsening anxiety symptoms  Assist with ADLs as needed  Safety precautions   Fall precautions  Family/ staff Communication:   Total Time:  Documentation:  Face to Face:  Family/Phone:   Labs/tests ordered: not due  Medication list reviewed and assessed for continued appropriateness. Monthly medication orders reviewed and signed.  Brynda Rim, NP-C Geriatrics [facility] [phone] [address], [place] [address] Cell Phone (Mon-Fri 8am-5pm):  [phone] On Call:  [phone] & follow prompts after 5pm & weekends Office Phone:  [phone] Office Fax:  [phone]

## 2018-03-11 NOTE — Assessment & Plan Note (Signed)
Stable. No reports of recent exacerbations. On Lexapro 10 mg po Q Day, Depakote sprinkles 375 mg po BID

## 2018-03-11 NOTE — Assessment & Plan Note (Signed)
Stable. No c/o worsening pain or myalgias. On APAP 650 mg po BID, Lexapro 10 mg po Q Day, Voltaren gel 1% 4 grams QID to knees.

## 2018-04-11 ENCOUNTER — Encounter
Admission: RE | Admit: 2018-04-11 | Discharge: 2018-04-11 | Disposition: A | Discharge status: Home / Self Care | Payer: Private Health Insurance - Indemnity | Source: Ambulatory Visit | Attending: Internal Medicine | Admitting: Internal Medicine

## 2018-05-06 ENCOUNTER — Encounter: Payer: Self-pay | Admitting: Adult Health

## 2018-05-06 ENCOUNTER — Non-Acute Institutional Stay: Payer: Medicare HMO | Admitting: Adult Health

## 2018-05-06 DIAGNOSIS — E785 Hyperlipidemia, unspecified: Secondary | ICD-10-CM

## 2018-05-06 DIAGNOSIS — F3340 Major depressive disorder, recurrent, in remission, unspecified: Secondary | ICD-10-CM

## 2018-05-06 DIAGNOSIS — F02818 Dementia in other diseases classified elsewhere, unspecified severity, with other behavioral disturbance: Secondary | ICD-10-CM

## 2018-05-06 DIAGNOSIS — G301 Alzheimer's disease with late onset: Secondary | ICD-10-CM | POA: Diagnosis not present

## 2018-05-06 DIAGNOSIS — F0281 Dementia in other diseases classified elsewhere with behavioral disturbance: Secondary | ICD-10-CM

## 2018-05-06 DIAGNOSIS — I25119 Atherosclerotic heart disease of native coronary artery with unspecified angina pectoris: Secondary | ICD-10-CM

## 2018-05-06 DIAGNOSIS — I10 Essential (primary) hypertension: Secondary | ICD-10-CM

## 2018-05-06 DIAGNOSIS — M159 Polyosteoarthritis, unspecified: Secondary | ICD-10-CM

## 2018-05-06 DIAGNOSIS — M150 Primary generalized (osteo)arthritis: Secondary | ICD-10-CM

## 2018-05-06 NOTE — Progress Notes (Signed)
Location:   [facility] [place] Room Number: [id number] Place of Service:  ALF (13)   CODE STATUS: FULL  Allergies  Allergen Reactions  . Epinephrine Other (See Comments)    Rapid heart beat.  . Dtap-Hepatitis B Recomb-Ipv Other (See Comments)  . Levofloxacin Swelling    Edema in knee Edema in knee  . Metronidazole Nausea Only    Nausea and edema in knee Nausea and edema in knee  . Other     Other reaction(s): Unknown Sympathomimetic agents   . Penicillins   . Tetanus Toxoids     Arm swelling  . Amoxicillin-Pot Clavulanate Rash    Chief Complaint  Patient presents with  . Medical Management of Chronic Issues    Hypertension; cad; alzheimer's disease     HPI:  She is a 84 year old long term resident of assisted living being seen for the management of her chronic illnesses: hypertension; cad; alzheimer's disease. She is unable to participate in the hpi or ros. There are no reports of uncontrolled pain; no changes in appetite; no signs of anxiety present. There are no nursing concerns at this time.   Past Medical History:  Diagnosis Date  . Allergy   . Anxiety   . Arthritis   . Asthma   . Baker's cyst    R knee  . Chest pain, unspecified 07/28/2013  . Coronary artery disease 07/28/2013  . Coronary heart disease   . Dementia   . Diverticulosis   . Encounter for Medicare annual wellness exam 09/29/2013  . Fibromyalgia   . Hematuria   . History of echocardiogram    EF > 55%, inf wall motion abnormalities, post MI  . Hyperlipidemia, unspecified    LDL 105 - Atorvostatin initiated 8/13, target LDL < 70  . Hypertension   . IBS (irritable bowel syndrome)   . Kidney stones   . Living will, counseling/discussion   . Mild dementia   . Myocardial infarct (HCC)    ST elevation MI  . Nephrolithiasis    [name]  . Osteoporosis   . Stented coronary artery    RCA 05/10/12, x2 DES mid and prox RCA  . UTI (lower urinary tract infection)     Past  Surgical History:  Procedure Laterality Date  . ANGIOPLASTY    . APPENDECTOMY    . BREAST BIOPSY     benign nodule  . BREAST SURGERY     bxs x 5  . CARDIAC CATHETERIZATION  05/09/2012   [facility]  . CHOLECYSTECTOMY    . COLONOSCOPY  08/12/2008   Pt declined repeat in 2014  . CORONARY ANGIOPLASTY WITH STENT PLACEMENT    . CYSTOSCOPY    . LITHOTRIPSY    . TONSILLECTOMY    . TOTAL ABDOMINAL HYSTERECTOMY W/ BILATERAL SALPINGOOPHORECTOMY    . URETEROSCOPY     with stent placement    Social History   Socioeconomic History  . Marital status: Married    Spouse name: Not on file  . Number of children: 0  . Years of education: 11  . Highest education level: High school graduate  Occupational History    Comment: retired Optometrist  Social Needs  . Financial resource strain: Not on file  . Food insecurity:    Worry: Not on file    Inability: Not on file  . Transportation needs:    Medical: Not on file    Non-medical: Not on file  Tobacco Use  . Smoking status: Never  Smoker  . Smokeless tobacco: Never Used  Substance and Sexual Activity  . Alcohol use: No  . Drug use: No  . Sexual activity: Not on file  Lifestyle  . Physical activity:    Days per week: Not on file    Minutes per session: Not on file  . Stress: Not on file  Relationships  . Social connections:    Talks on phone: Not on file    Gets together: Not on file    Attends religious service: Not on file    Active member of club or organization: Not on file    Attends meetings of clubs or organizations: Not on file    Relationship status: Not on file  . Intimate partner violence:    Fear of current or ex partner: Not on file    Emotionally abused: Not on file    Physically abused: Not on file    Forced sexual activity: Not on file  Other Topics Concern  . Not on file  Social History Narrative   Admitted to [facility] 10/10/2016   Married   Never smoker   Does not drink alcohol    Full Code    Family History  Problem Relation Age of Onset  . Cancer Mother   . Colon cancer Mother   . Heart attack Mother   . Alzheimer's disease Father   . Heart disease Father        MI in 75s  . Cancer Sister        multiple myeloma  . Heart attack Sister        in 21s  . Prostate cancer Neg Hx   . Bladder Cancer Neg Hx   . Kidney cancer Neg Hx       VITAL SIGNS BP (!) 114/53   Pulse 78   Temp 98.2 F (36.8 C) (Oral)   Resp 16   Ht _0  (1.6 m)   Wt 173 lb 1.6 oz (78.5 kg)   SpO2 98%   BMI 30.66 kg/m   Outpatient Encounter Medications as of 05/06/2018  Medication Sig  . acetaminophen (TYLENOL) 325 MG tablet Take 650 mg by mouth 2 (two) times daily.  Marland Kitchen aspirin 81 MG chewable tablet Chew 81 mg by mouth daily. With meal  . atorvastatin (LIPITOR) 20 MG tablet Take 20 mg by mouth daily. per Yolonda Kida, MD  . Cholecalciferol (VITAMIN D3) 2000 units capsule Take 2,000 Units by mouth daily.  . cyanocobalamin 1000 MCG tablet Take 1,000 mcg by mouth daily.  . diclofenac sodium (VOLTAREN) 1 % GEL Apply 4 g topically 4 (four) times daily. right knee for pain  . divalproex (DEPAKOTE SPRINKLE) 125 MG capsule Take 375 mg by mouth 2 (two) times daily. 3 caps  . escitalopram (LEXAPRO) 10 MG tablet Take 10 mg by mouth daily.   . furosemide (LASIX) 20 MG tablet Take 20 mg by mouth daily as needed. as needed for Edema per Dr. Clayborn Bigness  . galantamine (RAZADYNE) 8 MG tablet Take 8 mg by mouth 2 (two) times daily. give one at 8 am and one at 5 pm  . losartan (COZAAR) 25 MG tablet Take 25 mg by mouth daily. per Dr. Clayborn Bigness  . memantine (NAMENDA XR) 14 MG CP24 24 hr capsule Take 14 mg by mouth daily. Take with meals  . nitroGLYCERIN (NITROSTAT) 0.4 MG SL tablet Place 1 tablet under the tongue every 5 (five) minutes as needed for chest pain.   Marland Kitchen  NON FORMULARY Diet -X Liquids , Diet: _X_ regular  . traZODone (DESYREL) 50 MG tablet Take 25 mg by mouth 2 (two) times daily. 1/2 tab   No  facility-administered encounter medications on file as of 05/06/2018.      SIGNIFICANT DIAGNOSTIC EXAMS  LABS REVIEWED: TODAY:   12-04-17: wbc 5.7; hgb 13.2; hct 40.0; mcv 91.9 plt 194 glucose 103; bun 21; creat 0.91; k+ 3.9; na++ 140; ca 8.9; liver normal albumin 3.6; mag 2.4  tsh 0.719; chol 236; ldl 127; trig 297; hdl 50;  Vit D 45.0; vit B 12: 960  Review of Systems  Unable to perform ROS: Dementia (unable to participate )   Physical Exam  Constitutional: She appears well-developed and well-nourished. No distress.  Neck: No thyromegaly present.  Cardiovascular: Normal rate, regular rhythm, normal heart sounds and intact distal pulses.  Pulmonary/Chest: Effort normal and breath sounds normal. No respiratory distress.  Abdominal: Soft. Bowel sounds are normal. She exhibits no distension. There is no tenderness.  Musculoskeletal: She exhibits no edema.  Is able to move all extremities   Lymphadenopathy:    She has no cervical adenopathy.  Neurological: She is alert.  Skin: Skin is warm and dry. She is not diaphoretic.  Psychiatric: She has a normal mood and affect.      ASSESSMENT/ PLAN:  TODAY:   1.  Essential hypertension:  Is stable: b/p 114/53: will continue cozaar 25 mg daily  2.  Atherosclerosis of coronary artery: is stable will continue asa 81 mg daily has prn ntg   3. 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 Hyperlipidemia: is stable LDL 127 will continue lipitor 20 mg daily   5. Major depressive disorder recurrent, in remission, unspecified: is emotionally stable will continue lexapro 10 mg daily and depakote 375 mg twice daily trazodone 25 mg twice daily   6. Primary osteoarthritis of multiple joints: is stable will continue tylenol 650 mg twice daily and voltaren gel 1% 4 gm four times daily to right knee      MD is aware of  resident's narcotic use and is in agreement with current plan of care. We will attempt to wean resident as apropriate   Ok Edwards NP [facility]  Contact [phone] Monday through Friday 8am- 5pm  After hours call [phone]

## 2018-05-12 ENCOUNTER — Encounter
Admission: RE | Admit: 2018-05-12 | Discharge: 2018-05-12 | Disposition: A | Discharge status: Home / Self Care | Payer: Private Health Insurance - Indemnity | Source: Ambulatory Visit | Attending: Internal Medicine | Admitting: Internal Medicine

## 2018-06-11 ENCOUNTER — Encounter
Admission: RE | Admit: 2018-06-11 | Discharge: 2018-06-11 | Disposition: A | Discharge status: Home / Self Care | Payer: Private Health Insurance - Indemnity | Source: Ambulatory Visit | Attending: Internal Medicine | Admitting: Internal Medicine

## 2018-07-03 ENCOUNTER — Non-Acute Institutional Stay: Payer: Medicare HMO | Admitting: Adult Health

## 2018-07-03 ENCOUNTER — Encounter: Payer: Self-pay | Admitting: Adult Health

## 2018-07-03 DIAGNOSIS — E785 Hyperlipidemia, unspecified: Secondary | ICD-10-CM

## 2018-07-03 DIAGNOSIS — M150 Primary generalized (osteo)arthritis: Secondary | ICD-10-CM

## 2018-07-03 DIAGNOSIS — F3340 Major depressive disorder, recurrent, in remission, unspecified: Secondary | ICD-10-CM

## 2018-07-03 DIAGNOSIS — M159 Polyosteoarthritis, unspecified: Secondary | ICD-10-CM

## 2018-07-03 NOTE — Progress Notes (Signed)
[facility] Room Number: [place] of Service:  ALF (13)   CODE STATUS: Full Code  Allergies  Allergen Reactions  . Epinephrine Other (See Comments)    Rapid heart beat.  . Dtap-Hepatitis B Recomb-Ipv Other (See Comments)  . Levofloxacin Swelling    Edema in knee Edema in knee  . Metronidazole Nausea Only    Nausea and edema in knee Nausea and edema in knee  . Other     Other reaction(s): Unknown Sympathomimetic agents   . Penicillins   . Tetanus Toxoids     Arm swelling  . Amoxicillin-Pot Clavulanate Rash    Chief Complaint  Patient presents with  . Medical Management of Chronic Issues    Primary osteoarthritis involving multiple joints; major depressive disorder, recurrent, in remission, unspecified; hyperlipidemia, unspecified hyperlipidemia type.     HPI:  She is a 84 year old long term resident of assisted living being seen for the management of her chronic illnesses; osteoarthritis; major depressive disorder; hyperlipidemia. She is unable to participate in the hpi or ros. There are no reports of anxiety or agitation; no reports of changes in appetite; no reports of insomnia.   Past Medical History:  Diagnosis Date  . Allergy   . Anxiety   . Arthritis   . Asthma   . Baker's cyst    R knee  . Chest pain, unspecified 07/28/2013  . Coronary artery disease 07/28/2013  . Coronary heart disease   . Dementia ([place])   . Diverticulosis   . Encounter for Medicare annual wellness exam 09/29/2013  . Fibromyalgia   . Hematuria   . History of echocardiogram    EF > 55%, inf wall motion abnormalities, post MI  . Hyperlipidemia, unspecified    LDL 105 - Atorvostatin initiated 8/13, target LDL < 70  . Hypertension   . IBS (irritable bowel syndrome)   . Kidney stones   . Living will, counseling/discussion   . Mild dementia ([place])   . Myocardial infarct (HCC)    ST elevation MI  . Nephrolithiasis    [name]  . Osteoporosis     . Stented coronary artery    RCA 05/10/12, x2 DES mid and prox RCA  . UTI (lower urinary tract infection)     Past Surgical History:  Procedure Laterality Date  . ANGIOPLASTY    . APPENDECTOMY    . BREAST BIOPSY     benign nodule  . BREAST SURGERY     bxs x 5  . CARDIAC CATHETERIZATION  05/09/2012   [facility]  . CHOLECYSTECTOMY    . COLONOSCOPY  08/12/2008   Pt declined repeat in 2014  . CORONARY ANGIOPLASTY WITH STENT PLACEMENT    . CYSTOSCOPY    . LITHOTRIPSY    . TONSILLECTOMY    . TOTAL ABDOMINAL HYSTERECTOMY W/ BILATERAL SALPINGOOPHORECTOMY    . URETEROSCOPY     with stent placement    Social History   Socioeconomic History  . Marital status: Married    Spouse name: Not on file  . Number of children: 0  . Years of education: 79  . Highest education level: High school graduate  Occupational History    Comment: retired Optometrist  Social Needs  . Financial resource strain: Not on file  . Food insecurity:    Worry: Not on file    Inability: Not on file  . Transportation needs:    Medical: Not on file  Non-medical: Not on file  Tobacco Use  . Smoking status: Never Smoker  . Smokeless tobacco: Never Used  Substance and Sexual Activity  . Alcohol use: No  . Drug use: No  . Sexual activity: Not on file  Lifestyle  . Physical activity:    Days per week: Not on file    Minutes per session: Not on file  . Stress: Not on file  Relationships  . Social connections:    Talks on phone: Not on file    Gets together: Not on file    Attends religious service: Not on file    Active member of club or organization: Not on file    Attends meetings of clubs or organizations: Not on file    Relationship status: Not on file  . Intimate partner violence:    Fear of current or ex partner: Not on file    Emotionally abused: Not on file    Physically abused: Not on file    Forced sexual activity: Not on file  Other Topics Concern  . Not on file  Social History Narrative    Admitted to [facility] 10/10/2016   Married   Never smoker   Does not drink alcohol    Full Code   Family History  Problem Relation Age of Onset  . Cancer Mother   . Colon cancer Mother   . Heart attack Mother   . Alzheimer's disease Father   . Heart disease Father        MI in 59s  . Cancer Sister        multiple myeloma  . Heart attack Sister        in 37s  . Prostate cancer Neg Hx   . Bladder Cancer Neg Hx   . Kidney cancer Neg Hx       VITAL SIGNS Ht '5\' 3"'  (1.6 m)   Wt 172 lb 3.2 oz (78.1 kg)   BMI 30.50 kg/m  b/p 142/56   Outpatient Encounter Medications as of 07/03/2018  Medication Sig  . acetaminophen (TYLENOL) 325 MG tablet Take 650 mg by mouth 2 (two) times daily.  Marland Kitchen aspirin 81 MG chewable tablet Chew 81 mg by mouth daily. With meal  . atorvastatin (LIPITOR) 20 MG tablet Take 20 mg by mouth daily. per Yolonda Kida, MD  . Cholecalciferol (VITAMIN D3) 2000 units capsule Take 2,000 Units by mouth daily.  . cyanocobalamin 1000 MCG tablet Take 1,000 mcg by mouth daily.  . diclofenac sodium (VOLTAREN) 1 % GEL Apply 4 g topically 4 (four) times daily. right knee for pain  . divalproex (DEPAKOTE SPRINKLE) 125 MG capsule Take 375 mg by mouth 2 (two) times daily. 3 caps  . escitalopram (LEXAPRO) 10 MG tablet Take 10 mg by mouth daily.   . furosemide (LASIX) 20 MG tablet Take 20 mg by mouth daily as needed. as needed for Edema per Dr. Clayborn Bigness  . galantamine (RAZADYNE) 8 MG tablet Take 8 mg by mouth 2 (two) times daily. give one at 8 am and one at 5 pm  . losartan (COZAAR) 25 MG tablet Take 25 mg by mouth daily. per Dr. Clayborn Bigness  . memantine (NAMENDA XR) 14 MG CP24 24 hr capsule Take 14 mg by mouth daily. Take with meals  . nitroGLYCERIN (NITROSTAT) 0.4 MG SL tablet Place 1 tablet under the tongue every 5 (five) minutes as needed for chest pain.   . NON FORMULARY Diet -X Liquids , Diet: _X_ regular  .  traZODone (DESYREL) 50 MG tablet Take 25 mg by mouth 2  (two) times daily. 1/2 tab   No facility-administered encounter medications on file as of 07/03/2018.      SIGNIFICANT DIAGNOSTIC EXAMS  LABS REVIEWED: PREVIOUS:   12-04-17: wbc 5.7; hgb 13.2; hct 40.0; mcv 91.9 plt 194 glucose 103; bun 21; creat 0.91; k+ 3.9; na++ 140; ca 8.9; liver normal albumin 3.6; mag 2.4  tsh 0.719; chol 236; ldl 127; trig 297; hdl 50;  Vit D 45.0; vit B 12: 960  NO NEW LABS.    Review of Systems  Unable to perform ROS: Dementia (unable to participate )    Physical Exam  Constitutional: She appears well-developed and well-nourished. No distress.  Neck: No thyromegaly present.  Cardiovascular: Normal rate, regular rhythm, normal heart sounds and intact distal pulses.  Pulmonary/Chest: Effort normal and breath sounds normal. No respiratory distress.  Abdominal: Soft. Bowel sounds are normal. She exhibits no distension. There is no tenderness.  Musculoskeletal: She exhibits no edema.  Is able to move all extremities   Lymphadenopathy:    She has no cervical adenopathy.  Neurological: She is alert.  Skin: Skin is warm and dry. She is not diaphoretic.  Psychiatric: She has a normal mood and affect.      ASSESSMENT/ PLAN:  TODAY:   1. Hyperlipidemia: is stable LDL 127 will continue lipitor 20 mg daily   2. Major depressive disorder recurrent, in remission, unspecified: is emotionally stable will continue lexapro 10 mg daily and depakote 375 mg twice daily trazodone 25 mg twice daily   3. Primary osteoarthritis of multiple joints: is stable will continue tylenol 650 mg twice daily and voltaren gel 1% 4 gm four times daily to right knee   PREVIOUS   4.  Essential hypertension:  Is stable: b/p 142/56: will continue cozaar 25 mg daily  5.  Atherosclerosis of coronary artery: is stable will continue asa 81 mg daily has prn ntg   6. 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 We will attempt to wean resident as apropriate   Ok Edwards NP [facility]  Contact [phone] Monday through Friday 8am- 5pm  After hours call [phone]

## 2018-07-09 ENCOUNTER — Non-Acute Institutional Stay: Payer: Medicare HMO | Admitting: Adult Health

## 2018-07-09 ENCOUNTER — Encounter: Payer: Self-pay | Admitting: Adult Health

## 2018-07-09 DIAGNOSIS — J0110 Acute frontal sinusitis, unspecified: Secondary | ICD-10-CM

## 2018-07-09 NOTE — Progress Notes (Signed)
[facility]   [facility] Room Number: [place] of Service:  ALF (13)   CODE STATUS: Full Code  Allergies  Allergen Reactions  . Epinephrine Other (See Comments)    Rapid heart beat.  . Dtap-Hepatitis B Recomb-Ipv Other (See Comments)  . Levofloxacin Swelling    Edema in knee Edema in knee  . Metronidazole Nausea Only    Nausea and edema in knee Nausea and edema in knee  . Other     Other reaction(s): Unknown Sympathomimetic agents   . Penicillins   . Tetanus Toxoids     Arm swelling  . Amoxicillin-Pot Clavulanate Rash    Chief Complaint  Patient presents with  . Acute Visit    Fever / cough / congestion    HPI:  She has had a cough with congestion and low grade temps up to 100.0 for the past several days. There are no reports of changes in appetite. She is somewhat more lethargic. The staff had initialized standing protocols for fever and cough. There is concern that she is developing an infection.    Past Medical History:  Diagnosis Date  . Allergy   . Anxiety   . Arthritis   . Asthma   . Baker's cyst    R knee  . Chest pain, unspecified 07/28/2013  . Coronary artery disease 07/28/2013  . Coronary heart disease   . Dementia ([place])   . Diverticulosis   . Encounter for Medicare annual wellness exam 09/29/2013  . Fibromyalgia   . Hematuria   . History of echocardiogram    EF > 55%, inf wall motion abnormalities, post MI  . Hyperlipidemia, unspecified    LDL 105 - Atorvostatin initiated 8/13, target LDL < 70  . Hypertension   . IBS (irritable bowel syndrome)   . Kidney stones   . Living will, counseling/discussion   . Mild dementia ([place])   . Myocardial infarct (HCC)    ST elevation MI  . Nephrolithiasis    [name]  . Osteoporosis   . Stented coronary artery    RCA 05/10/12, x2 DES mid and prox RCA  . UTI (lower urinary tract infection)     Past Surgical History:  Procedure Laterality Date  . ANGIOPLASTY    .  APPENDECTOMY    . BREAST BIOPSY     benign nodule  . BREAST SURGERY     bxs x 5  . CARDIAC CATHETERIZATION  05/09/2012   [facility]  . CHOLECYSTECTOMY    . COLONOSCOPY  08/12/2008   Pt declined repeat in 2014  . CORONARY ANGIOPLASTY WITH STENT PLACEMENT    . CYSTOSCOPY    . LITHOTRIPSY    . TONSILLECTOMY    . TOTAL ABDOMINAL HYSTERECTOMY W/ BILATERAL SALPINGOOPHORECTOMY    . URETEROSCOPY     with stent placement    Social History   Socioeconomic History  . Marital status: Married    Spouse name: Not on file  . Number of children: 0  . Years of education: 17  . Highest education level: High school graduate  Occupational History    Comment: retired Optometrist  Social Needs  . Financial resource strain: Not on file  . Food insecurity:    Worry: Not on file    Inability: Not on file  . Transportation needs:    Medical: Not on file    Non-medical: Not on file  Tobacco Use  . Smoking status: Never Smoker  . Smokeless tobacco: Never  Used  Substance and Sexual Activity  . Alcohol use: No  . Drug use: No  . Sexual activity: Not on file  Lifestyle  . Physical activity:    Days per week: Not on file    Minutes per session: Not on file  . Stress: Not on file  Relationships  . Social connections:    Talks on phone: Not on file    Gets together: Not on file    Attends religious service: Not on file    Active member of club or organization: Not on file    Attends meetings of clubs or organizations: Not on file    Relationship status: Not on file  . Intimate partner violence:    Fear of current or ex partner: Not on file    Emotionally abused: Not on file    Physically abused: Not on file    Forced sexual activity: Not on file  Other Topics Concern  . Not on file  Social History Narrative   Admitted to [facility] 10/10/2016   Married   Never smoker   Does not drink alcohol    Full Code   Family History  Problem Relation Age of Onset  . Cancer Mother   .  Colon cancer Mother   . Heart attack Mother   . Alzheimer's disease Father   . Heart disease Father        MI in 44s  . Cancer Sister        multiple myeloma  . Heart attack Sister        in 55s  . Prostate cancer Neg Hx   . Bladder Cancer Neg Hx   . Kidney cancer Neg Hx       VITAL SIGNS BP 130/74   Pulse 72   Temp 98.9 F (37.2 C)   Resp 16   Ht _0  (1.6 m)   Wt 172 lb 3.2 oz (78.1 kg)   SpO2 97%   BMI 30.50 kg/m   Outpatient Encounter Medications as of 07/09/2018  Medication Sig  . acetaminophen (TYLENOL) 325 MG tablet Take 650 mg by mouth 2 (two) times daily.  Marland Kitchen aspirin 81 MG chewable tablet Chew 81 mg by mouth daily. With meal  . atorvastatin (LIPITOR) 20 MG tablet Take 20 mg by mouth daily. per Yolonda Kida, MD  . Cholecalciferol (VITAMIN D3) 2000 units capsule Take 2,000 Units by mouth daily.  . cyanocobalamin 1000 MCG tablet Take 1,000 mcg by mouth daily.  . diclofenac sodium (VOLTAREN) 1 % GEL Apply 4 g topically 4 (four) times daily. right knee for pain  . divalproex (DEPAKOTE SPRINKLE) 125 MG capsule Take 375 mg by mouth 2 (two) times daily. 3 caps  . escitalopram (LEXAPRO) 10 MG tablet Take 10 mg by mouth daily.   . furosemide (LASIX) 20 MG tablet Take 20 mg by mouth daily as needed. as needed for Edema per Dr. Clayborn Bigness  . galantamine (RAZADYNE) 8 MG tablet Take 8 mg by mouth 2 (two) times daily. give one at 8 am and one at 5 pm  . losartan (COZAAR) 25 MG tablet Take 25 mg by mouth daily. per Dr. Clayborn Bigness  . memantine (NAMENDA XR) 14 MG CP24 24 hr capsule Take 14 mg by mouth daily. Take with meals  . nitroGLYCERIN (NITROSTAT) 0.4 MG SL tablet Place 1 tablet under the tongue every 5 (five) minutes as needed for chest pain.   . NON FORMULARY Diet -X Liquids , Diet:  _X_ regular  . traZODone (DESYREL) 50 MG tablet Take 25 mg by mouth 2 (two) times daily. 1/2 tab   No facility-administered encounter medications on file as of 07/09/2018.      SIGNIFICANT  DIAGNOSTIC EXAMS   LABS REVIEWED: PREVIOUS:   12-04-17: wbc 5.7; hgb 13.2; hct 40.0; mcv 91.9 plt 194 glucose 103; bun 21; creat 0.91; k+ 3.9; na++ 140; ca 8.9; liver normal albumin 3.6; mag 2.4  tsh 0.719; chol 236; ldl 127; trig 297; hdl 50;  Vit D 45.0; vit B 12: 960  NO NEW LABS.    Review of Systems  Unable to perform ROS: Dementia (unable to participate )   Physical Exam  Constitutional: She appears well-developed and well-nourished. No distress.  HENT:  Nose: Nose normal.  Oropharynx is red and inflamed without drainage Has facial tenderness   Neck: No thyromegaly present.  Right side and is tender to touch   Cardiovascular: Normal rate, regular rhythm, normal heart sounds and intact distal pulses.  Pulmonary/Chest: Effort normal and breath sounds normal. No respiratory distress.  Abdominal: Soft. Bowel sounds are normal. She exhibits no distension. There is no tenderness.  Musculoskeletal: Normal range of motion. She exhibits no edema.  Lymphadenopathy:    She has cervical adenopathy.  Neurological: She is alert.  Skin: Skin is warm and dry. She is not diaphoretic.  Psychiatric: She has a normal mood and affect.     ASSESSMENT/ PLAN:  TODAY ;  1. Acute sinusitis: is worse: will begin zithromax 500 mg daily through 07-16-18 with florastor twice daily will monitor   MD is aware of resident's narcotic use and is in agreement with current plan of care. We will attempt to wean resident as apropriate   Ok Edwards NP [facility]  Contact [phone] Monday through Friday 8am- 5pm  After hours call [phone]

## 2018-07-12 ENCOUNTER — Encounter
Admission: RE | Admit: 2018-07-12 | Discharge: 2018-07-12 | Disposition: A | Discharge status: Home / Self Care | Payer: Private Health Insurance - Indemnity | Source: Ambulatory Visit | Attending: Internal Medicine | Admitting: Internal Medicine

## 2018-08-30 ENCOUNTER — Non-Acute Institutional Stay: Payer: Medicare HMO | Admitting: Adult Health

## 2018-08-30 ENCOUNTER — Encounter: Payer: Self-pay | Admitting: Adult Health

## 2018-08-30 DIAGNOSIS — G301 Alzheimer's disease with late onset: Secondary | ICD-10-CM | POA: Diagnosis not present

## 2018-08-30 DIAGNOSIS — F02818 Dementia in other diseases classified elsewhere, unspecified severity, with other behavioral disturbance: Secondary | ICD-10-CM

## 2018-08-30 DIAGNOSIS — F0281 Dementia in other diseases classified elsewhere with behavioral disturbance: Secondary | ICD-10-CM

## 2018-08-30 DIAGNOSIS — I25118 Atherosclerotic heart disease of native coronary artery with other forms of angina pectoris: Secondary | ICD-10-CM

## 2018-08-30 DIAGNOSIS — I10 Essential (primary) hypertension: Secondary | ICD-10-CM

## 2018-08-30 NOTE — Progress Notes (Signed)
[facility]   [facility] Room Number: [place] of Service:  ALF (13)   CODE STATUS: Full Code  Allergies  Allergen Reactions  . Epinephrine Other (See Comments)    Rapid heart beat.  . Dtap-Hepatitis B Recomb-Ipv Other (See Comments)  . Levofloxacin Swelling    Edema in knee Edema in knee  . Metronidazole Nausea Only    Nausea and edema in knee Nausea and edema in knee  . Other     Cayla Wiegand Peppers - Other reaction(s): Unknown Sympathomimetic agents   . Penicillins   . Tetanus Toxoids     Arm swelling  . Amoxicillin-Pot Clavulanate Rash    Chief Complaint  Patient presents with  . Medical Management of Chronic Issues    atherosclerosis of native coronary artery of native heart with other forms of angina pectoris; essential hypertension; late onset of alzheimer's disease with behavioral disturbance.     HPI:  She is a 84 year old long term resident of assisted living being seem for the management of her chronic illnesses: cad; hypertension; dementia. There are no reports of changes in appetite; no indications of pain; no insomnia; no agitation.   Past Medical History:  Diagnosis Date  . Allergy   . Anxiety   . Arthritis   . Asthma   . Baker's cyst    R knee  . Chest pain, unspecified 07/28/2013  . Coronary artery disease 07/28/2013  . Coronary heart disease   . Dementia ([place])   . Diverticulosis   . Encounter for Medicare annual wellness exam 09/29/2013  . Fibromyalgia   . Hematuria   . History of echocardiogram    EF > 55%, inf wall motion abnormalities, post MI  . Hyperlipidemia, unspecified    LDL 105 - Atorvostatin initiated 8/13, target LDL < 70  . Hypertension   . IBS (irritable bowel syndrome)   . Kidney stones   . Living will, counseling/discussion   . Mild dementia ([place])   . Myocardial infarct (HCC)    ST elevation MI  . Nephrolithiasis    [name]  . Osteoporosis   . Stented coronary artery    RCA 05/10/12, x2 DES  mid and prox RCA  . UTI (lower urinary tract infection)     Past Surgical History:  Procedure Laterality Date  . ANGIOPLASTY    . APPENDECTOMY    . BREAST BIOPSY     benign nodule  . BREAST SURGERY     bxs x 5  . CARDIAC CATHETERIZATION  05/09/2012   [facility]  . CHOLECYSTECTOMY    . COLONOSCOPY  08/12/2008   Pt declined repeat in 2014  . CORONARY ANGIOPLASTY WITH STENT PLACEMENT    . CYSTOSCOPY    . LITHOTRIPSY    . TONSILLECTOMY    . TOTAL ABDOMINAL HYSTERECTOMY W/ BILATERAL SALPINGOOPHORECTOMY    . URETEROSCOPY     with stent placement    Social History   Socioeconomic History  . Marital status: Married    Spouse name: Not on file  . Number of children: 0  . Years of education: 12  . Highest education level: High school graduate  Occupational History    Comment: retired Optometrist  Social Needs  . Financial resource strain: Not on file  . Food insecurity:    Worry: Not on file    Inability: Not on file  . Transportation needs:    Medical: Not on file    Non-medical: Not on file  Tobacco Use  . Smoking status: Never Smoker  . Smokeless tobacco: Never Used  Substance and Sexual Activity  . Alcohol use: No  . Drug use: No  . Sexual activity: Not on file  Lifestyle  . Physical activity:    Days per week: Not on file    Minutes per session: Not on file  . Stress: Not on file  Relationships  . Social connections:    Talks on phone: Not on file    Gets together: Not on file    Attends religious service: Not on file    Active member of club or organization: Not on file    Attends meetings of clubs or organizations: Not on file    Relationship status: Not on file  . Intimate partner violence:    Fear of current or ex partner: Not on file    Emotionally abused: Not on file    Physically abused: Not on file    Forced sexual activity: Not on file  Other Topics Concern  . Not on file  Social History Narrative   Admitted to [facility] 10/10/2016    Married   Never smoker   Does not drink alcohol    Full Code   Family History  Problem Relation Age of Onset  . Cancer Mother   . Colon cancer Mother   . Heart attack Mother   . Alzheimer's disease Father   . Heart disease Father        MI in 38s  . Cancer Sister        multiple myeloma  . Heart attack Sister        in 64s  . Prostate cancer Neg Hx   . Bladder Cancer Neg Hx   . Kidney cancer Neg Hx       VITAL SIGNS BP 132/62   Pulse 68   Temp (!) 96.6 F (35.9 C)   Resp 16   Ht '5\' 3"'  (1.6 m)   Wt 176 lb 6.4 oz (80 kg)   SpO2 97%   BMI 31.25 kg/m   Outpatient Encounter Medications as of 08/30/2018  Medication Sig  . acetaminophen (TYLENOL) 325 MG tablet Take 650 mg by mouth 2 (two) times daily.  Marland Kitchen aspirin 81 MG chewable tablet Chew 81 mg by mouth daily. With meal  . atorvastatin (LIPITOR) 20 MG tablet Take 20 mg by mouth daily. per Yolonda Kida, MD  . Cholecalciferol (VITAMIN D3) 2000 units capsule Take 2,000 Units by mouth daily.  . cyanocobalamin 1000 MCG tablet Take 1,000 mcg by mouth daily.  . diclofenac sodium (VOLTAREN) 1 % GEL Apply 4 g topically 4 (four) times daily. right knee for pain  . divalproex (DEPAKOTE SPRINKLE) 125 MG capsule Take 375 mg by mouth 2 (two) times daily. 3 caps  . escitalopram (LEXAPRO) 10 MG tablet Take 10 mg by mouth daily.   . furosemide (LASIX) 20 MG tablet Take 20 mg by mouth daily as needed. as needed for Edema per Dr. Clayborn Bigness  . galantamine (RAZADYNE) 8 MG tablet Take 8 mg by mouth 2 (two) times daily. give one at 8 am and one at 5 pm  . losartan (COZAAR) 25 MG tablet Take 25 mg by mouth daily. per Dr. Clayborn Bigness  . memantine (NAMENDA XR) 14 MG CP24 24 hr capsule Take 14 mg by mouth daily. Take with meals  . nitroGLYCERIN (NITROSTAT) 0.4 MG SL tablet Place 1 tablet under the tongue every 5 (five) minutes as  needed for chest pain.   . NON FORMULARY Diet -X Liquids , Diet: _X_ regular  . traZODone (DESYREL) 50 MG tablet Take  25 mg by mouth 2 (two) times daily. 1/2 tab   No facility-administered encounter medications on file as of 08/30/2018.      SIGNIFICANT DIAGNOSTIC EXAMS  LABS REVIEWED: PREVIOUS:   12-04-17: wbc 5.7; hgb 13.2; hct 40.0; mcv 91.9 plt 194 glucose 103; bun 21; creat 0.91; k+ 3.9; na++ 140; ca 8.9; liver normal albumin 3.6; mag 2.4  tsh 0.719; chol 236; ldl 127; trig 297; hdl 50;  Vit D 45.0; vit B 12: 960  NO NEW LABS.    Review of Systems  Unable to perform ROS: Dementia (unable to participate )    Physical Exam Constitutional:      General: She is not in acute distress.    Appearance: Normal appearance. She is well-developed. She is not diaphoretic.  Neck:     Musculoskeletal: Neck supple.     Thyroid: No thyromegaly.  Cardiovascular:     Rate and Rhythm: Normal rate and regular rhythm.     Pulses: Normal pulses.     Heart sounds: Normal heart sounds.  Pulmonary:     Effort: Pulmonary effort is normal. No respiratory distress.     Breath sounds: Normal breath sounds.  Abdominal:     General: Bowel sounds are normal. There is no distension.     Palpations: Abdomen is soft.     Tenderness: There is no abdominal tenderness.  Musculoskeletal: Normal range of motion.     Right lower leg: No edema.     Left lower leg: No edema.  Lymphadenopathy:     Cervical: No cervical adenopathy.  Skin:    General: Skin is warm and dry.  Neurological:     Mental Status: She is alert. Mental status is at baseline.  Psychiatric:        Mood and Affect: Mood normal.       ASSESSMENT/ PLAN:  TODAY:   1.  Essential hypertension:  Is stable: b/p 132/62: will continue cozaar 25 mg daily  2.  Atherosclerosis of coronary artery: is stable will continue asa 81 mg daily has prn ntg   3. Alzheimer's dementia late onset with behavorial disturbance: is without change: weight is  176 (previous 172) pounds; will continue razadyne 8 mg twice daily and namenda xr 14 mg daily  PREVIOUS   4.  Hyperlipidemia: is stable LDL 127 will continue lipitor 20 mg daily   5. Major depressive disorder recurrent, in remission, unspecified: is emotionally stable will continue lexapro 10 mg daily and depakote 375 mg twice daily trazodone 25 mg twice daily   6. Primary osteoarthritis of multiple joints: is stable will continue tylenol 650 mg twice daily and voltaren gel 1% 4 gm four times daily to right knee   Will get cbc; cmp; lipids; depakote   MD is aware of resident's narcotic use and is in agreement with current plan of care. We will attempt to wean resident as apropriate   Ok Edwards NP [facility]  Contact [phone] Monday through Friday 8am- 5pm  After hours call [phone]

## 2018-09-02 ENCOUNTER — Other Ambulatory Visit
Admission: RE | Admit: 2018-09-02 | Discharge: 2018-09-02 | Disposition: A | Discharge status: Home / Self Care | Payer: Medicare HMO | Source: Ambulatory Visit | Attending: Adult Health | Admitting: Adult Health

## 2018-09-02 DIAGNOSIS — I25119 Atherosclerotic heart disease of native coronary artery with unspecified angina pectoris: Secondary | ICD-10-CM | POA: Insufficient documentation

## 2018-09-02 LAB — CBC
HEMATOCRIT: 42 % (ref 36.0–46.0)
Hemoglobin: 13.3 g/dL (ref 12.0–15.0)
MCH: 30.1 pg (ref 26.0–34.0)
MCHC: 31.7 g/dL (ref 30.0–36.0)
MCV: 95 fL (ref 80.0–100.0)
Platelets: 174 10*3/uL (ref 150–400)
RBC: 4.42 MIL/uL (ref 3.87–5.11)
RDW: 13.4 % (ref 11.5–15.5)
WBC: 7.1 10*3/uL (ref 4.0–10.5)
nRBC: 0 % (ref 0.0–0.2)

## 2018-09-02 LAB — COMPREHENSIVE METABOLIC PANEL
ALT: 18 U/L (ref 0–44)
ANION GAP: 7 (ref 5–15)
AST: 26 U/L (ref 15–41)
Albumin: 3.8 g/dL (ref 3.5–5.0)
Alkaline Phosphatase: 60 U/L (ref 38–126)
BUN: 21 mg/dL (ref 8–23)
CO2: 28 mmol/L (ref 22–32)
Calcium: 8.5 mg/dL — ABNORMAL LOW (ref 8.9–10.3)
Chloride: 104 mmol/L (ref 98–111)
Creatinine, Ser: 0.85 mg/dL (ref 0.44–1.00)
Glucose, Bld: 121 mg/dL — ABNORMAL HIGH (ref 70–99)
POTASSIUM: 4.2 mmol/L (ref 3.5–5.1)
Sodium: 139 mmol/L (ref 135–145)
Total Bilirubin: 0.6 mg/dL (ref 0.3–1.2)
Total Protein: 6.6 g/dL (ref 6.5–8.1)

## 2018-09-02 LAB — LIPID PANEL
CHOL/HDL RATIO: 3.1 ratio
CHOLESTEROL: 163 mg/dL (ref 0–200)
HDL: 52 mg/dL (ref >40)
LDL Cholesterol: 50 mg/dL (ref 0–99)
TRIGLYCERIDES: 307 mg/dL — AB (ref <150)
VLDL: 61 mg/dL — ABNORMAL HIGH (ref 0–40)

## 2018-09-02 LAB — VALPROIC ACID LEVEL: Valproic Acid Lvl: 18 ug/mL — ABNORMAL LOW (ref 50.0–100.0)

## 2018-09-23 ENCOUNTER — Encounter
Admission: RE | Admit: 2018-09-23 | Discharge: 2018-09-23 | Disposition: A | Discharge status: Home / Self Care | Payer: Private Health Insurance - Indemnity | Source: Ambulatory Visit | Attending: Internal Medicine | Admitting: Internal Medicine

## 2018-10-12 ENCOUNTER — Encounter
Admission: RE | Admit: 2018-10-12 | Discharge: 2018-10-12 | Disposition: A | Discharge status: Home / Self Care | Payer: Private Health Insurance - Indemnity | Source: Ambulatory Visit | Attending: Internal Medicine | Admitting: Internal Medicine

## 2018-11-13 ENCOUNTER — Non-Acute Institutional Stay: Payer: Medicare HMO | Admitting: Adult Health

## 2018-11-13 ENCOUNTER — Encounter
Admission: RE | Admit: 2018-11-13 | Discharge: 2018-11-13 | Disposition: A | Discharge status: Home / Self Care | Payer: Private Health Insurance - Indemnity | Source: Ambulatory Visit | Attending: Internal Medicine | Admitting: Internal Medicine

## 2018-11-13 ENCOUNTER — Encounter: Payer: Self-pay | Admitting: Adult Health

## 2018-11-13 DIAGNOSIS — I25118 Atherosclerotic heart disease of native coronary artery with other forms of angina pectoris: Secondary | ICD-10-CM

## 2018-11-13 DIAGNOSIS — F39 Unspecified mood [affective] disorder: Secondary | ICD-10-CM

## 2018-11-13 DIAGNOSIS — F3340 Major depressive disorder, recurrent, in remission, unspecified: Secondary | ICD-10-CM | POA: Diagnosis not present

## 2018-11-13 DIAGNOSIS — E785 Hyperlipidemia, unspecified: Secondary | ICD-10-CM | POA: Diagnosis not present

## 2018-11-13 DIAGNOSIS — I10 Essential (primary) hypertension: Secondary | ICD-10-CM | POA: Diagnosis not present

## 2018-11-13 DIAGNOSIS — G301 Alzheimer's disease with late onset: Secondary | ICD-10-CM

## 2018-11-13 DIAGNOSIS — F02818 Dementia in other diseases classified elsewhere, unspecified severity, with other behavioral disturbance: Secondary | ICD-10-CM

## 2018-11-13 DIAGNOSIS — F0281 Dementia in other diseases classified elsewhere with behavioral disturbance: Secondary | ICD-10-CM

## 2018-11-13 NOTE — Progress Notes (Signed)
Location:  [facility] Room Number: 247-P Place of Service:  ALF [phone]) Provider:  Kenard Gower, NP  Patient Care Team: [name], MD as PCP - General (Internal Medicine) Sharee Holster, NP as Nurse Practitioner (Geriatric Medicine)  Extended Emergency Contact Information Primary Emergency Contact: Borquez,[name] Address: [address]          [place], [place] [address] [place] Home Phone: [phone] Mobile Phone: [phone] Relation: Spouse Secondary Emergency Contact: Lelon Perla [place] Home Phone: [phone] Work Phone: [phone] Mobile Phone: [phone] Relation: Niece  Code Status:  Full Code  Goals of care: Advanced Directive information Advanced Directives 08/30/2018  Does Patient Have a Medical Advance Directive? No  Type of Advance Directive -  Does patient want to make changes to medical advance directive? -  Copy of Healthcare Power of Attorney in Chart? -  Would patient like information on creating a medical advance directive? No - Patient declined     Chief Complaint  Patient presents with  . Medical Management of Chronic Issues    Routine [facility] visit    HPI:  Pt is an 85 y.o. female seen today for medical management of chronic diseases.  She is a long-term care resident of [facility]. She has a PMH of Alzheimer's disease, MI, HLD, anxiety disorder, depression, and atherosclerotic heart disease. She was seen in her room today. She was earlier seen watching a movie together with other resident. She was afraid that she is going to have injection so I assured her that she won't get one. No reported agitation   Past Medical History:  Diagnosis Date  . Allergy   . Anxiety   . Arthritis   . Asthma   . Baker's cyst    R knee  . Chest pain, unspecified 07/28/2013  . Coronary artery disease 07/28/2013  . Coronary heart disease   . Dementia (HCC)   .  Diverticulosis   . Encounter for Medicare annual wellness exam 09/29/2013  . Fibromyalgia   . Hematuria   . History of echocardiogram    EF > 55%, inf wall motion abnormalities, post MI  . Hyperlipidemia, unspecified    LDL 105 - Atorvostatin initiated 8/13, target LDL < 70  . Hypertension   . IBS (irritable bowel syndrome)   . Kidney stones   . Living will, counseling/discussion   . Mild dementia (HCC)   . Myocardial infarct (HCC)    ST elevation MI  . Nephrolithiasis    [name]  . Osteoporosis   . Stented coronary artery    RCA 05/10/12, x2 DES mid and prox RCA  . UTI (lower urinary tract infection)    Past Surgical History:  Procedure Laterality Date  . ANGIOPLASTY    . APPENDECTOMY    . BREAST BIOPSY     benign nodule  . BREAST SURGERY     bxs x 5  . CARDIAC CATHETERIZATION  05/09/2012   [facility]  . CHOLECYSTECTOMY    . COLONOSCOPY  08/12/2008   Pt declined repeat in 2014  . CORONARY ANGIOPLASTY WITH STENT PLACEMENT    . CYSTOSCOPY    . LITHOTRIPSY    . TONSILLECTOMY    . TOTAL ABDOMINAL HYSTERECTOMY W/ BILATERAL SALPINGOOPHORECTOMY    . URETEROSCOPY     with stent placement    Allergies  Allergen Reactions  . Epinephrine Other (See Comments)    Rapid heart beat.  . Dtap-Hepatitis  B Recomb-Ipv Other (See Comments)  . Levofloxacin Swelling    Edema in knee Edema in knee  . Metronidazole Nausea Only    Nausea and edema in knee Nausea and edema in knee  . Other     Green Peppers - Other reaction(s): Unknown Sympathomimetic agents   . Penicillins   . Tetanus Toxoids     Arm swelling  . Amoxicillin-Pot Clavulanate Rash    Outpatient Encounter Medications as of 11/13/2018  Medication Sig  . acetaminophen (TYLENOL) 325 MG tablet Take 650 mg by mouth 2 (two) times daily.  Marland Kitchen aspirin 81 MG chewable tablet Chew 81 mg by mouth daily. With meal  . atorvastatin (LIPITOR) 20 MG tablet Take 20 mg by mouth daily. per Alwyn Pea, MD  . Cholecalciferol (VITAMIN  D3) 2000 units capsule Take 2,000 Units by mouth daily.  . cyanocobalamin 1000 MCG tablet Take 1,000 mcg by mouth daily.  . diclofenac sodium (VOLTAREN) 1 % GEL Apply 4 g topically 4 (four) times daily.   . divalproex (DEPAKOTE SPRINKLE) 125 MG capsule Take 375 mg by mouth 2 (two) times daily. 3 capsules to = 375 mg  . escitalopram (LEXAPRO) 10 MG tablet Take 10 mg by mouth daily.   . furosemide (LASIX) 20 MG tablet Take 20 mg by mouth daily as needed for edema. per Dr. Juliann Pares  . galantamine (RAZADYNE) 8 MG tablet Take 8 mg by mouth 2 (two) times daily. give at 8:30 AM and 5:30 PM  . losartan (COZAAR) 25 MG tablet Take 25 mg by mouth daily. per Dr. Juliann Pares  . memantine (NAMENDA XR) 14 MG CP24 24 hr capsule Take 14 mg by mouth daily. Take with meals  . nitroGLYCERIN (NITROSTAT) 0.4 MG SL tablet Place 1 tablet under the tongue every 5 (five) minutes as needed for chest pain.   . NON FORMULARY Diet -X Liquids , Diet: _X_ regular  . traZODone (DESYREL) 50 MG tablet Take 25 mg by mouth 2 (two) times daily. 0.5 tablet to = 25 mg   No facility-administered encounter medications on file as of 11/13/2018.     Review of Systems  GENERAL: No change in appetite, no fatigue, no weight changes, no fever, chills or weakness MOUTH and THROAT: Denies oral discomfort, gingival pain or bleeding RESPIRATORY: no cough, SOB, DOE, wheezing, hemoptysis CARDIAC: No chest pain, or palpitations GI: No abdominal pain, diarrhea, constipation, heart burn, nausea or vomiting GU: Denies dysuria, frequency, hematuria, incontinence, or discharge NEUROLOGICAL: Denies dizziness, syncope, numbness, or headache PSYCHIATRIC: Denies feelings of depression or anxiety. No report of hallucinations, insomnia, paranoia, or agitation   Immunization History  Administered Date(s) Administered  . Influenza Whole 07/10/2013  . Influenza-Unspecified 06/05/2017, 06/27/2018  . PPD Test 10/14/2016  . Pneumococcal Polysaccharide-23  08/31/2006   Pertinent  Health Maintenance Due  Topic Date Due  . DEXA SCAN  12/14/2018 (Originally 06/06/1998)  . PNA vac Low Risk Adult (2 of 2 - PCV13) 11/13/2019 (Originally 09/01/2007)  . INFLUENZA VACCINE  Completed    Vitals:   11/13/18 0854  BP: (!) 150/66  Pulse: 71  Resp: 18  Temp: (!) 97.4 F (36.3 C)  TempSrc: Oral  SpO2: 97%  Weight: 179 lb 14.4 oz (81.6 kg)  Height: 5\' 3"  (1.6 m)   Body mass index is 31.87 kg/m.  Physical Exam  GENERAL APPEARANCE: Well nourished. In no acute distress. Obese SKIN:  Skin is warm and dry.  MOUTH and THROAT: Lips are without lesions. Oral mucosa is  moist and without lesions.  RESPIRATORY: Breathing is even & unlabored, BS CTAB CARDIAC: RRR, no murmur,no extra heart sounds, trace BLE edema GI: Abdomen soft, normal BS, no masses, no tenderness EXTREMITIES: Able to move X 4 extremities NEUROLOGICAL: There is no tremor. Speech is clear. Alert to self, disoriented to time and place. PSYCHIATRIC:  Affect and behavior are appropriate   Labs reviewed: Recent Labs    12/04/17 1647 09/02/18 1345  NA 140 139  K 3.9 4.2  CL 101 104  CO2 28 28  GLUCOSE 103* 121*  BUN 21* 21  CREATININE 0.91 0.85  CALCIUM 8.9 8.5*  MG 2.4  --    Recent Labs    12/04/17 1647 09/02/18 1345  AST 24 26  ALT 18 18  ALKPHOS 58 60  BILITOT 0.4 0.6  PROT 6.8 6.6  ALBUMIN 3.6 3.8   Recent Labs    12/04/17 1647 09/02/18 1345  WBC 5.7 7.1  NEUTROABS 3.4  --   HGB 13.2 13.3  HCT 40.0 42.0  MCV 91.9 95.0  PLT 194 174   Lab Results  Component Value Date   TSH 0.719 12/04/2017    Lab Results  Component Value Date   CHOL 163 09/02/2018   HDL 52 09/02/2018   LDLCALC 50 09/02/2018   TRIG 307 (H) 09/02/2018   CHOLHDL 3.1 09/02/2018     Assessment/Plan  1. Atherosclerosis of native coronary artery of native heart with other form of angina pectoris (HCC) -No complaints of chest pain, continue NTG as needed and aspirin 81 mg 1 tab  daily  2. Essential hypertension -Well-controlled, continue losartan 25 mg 1 tab daily  3. Hyperlipidemia, unspecified hyperlipidemia type Lab Results  Component Value Date   CHOL 163 09/02/2018   HDL 52 09/02/2018   LDLCALC 50 09/02/2018   TRIG 307 (H) 09/02/2018   CHOLHDL 3.1 09/02/2018  -Continue atorvastatin 20 mg 1 tab daily  4. Major depressive disorder, recurrent, in remission, unspecified (HCC) - denies having symptoms of depression, continue Lexapro 10 mg 1 tab daily  5. Mood disorder (HCC) -Mood  is stable, continue divalproex DR sprinkle 125 mg give 3 capsules = 375 mg twice a day  6. Late onset Alzheimer's disease with behavioral disturbance (HCC) -Continue galantamine 8 mg 1 tab twice a day and Namenda XR 40 mg 1 capsule daily   Family/ staff Communication: Discussed plan of care with resident.  Labs/tests ordered:  None  Goals of care:   Long-term care in ALF.   Kenard Gower, NP [facility] [phone] (Monday-Friday 8:00 a.m. - 5:00 p.m.) [phone] (after hours)

## 2018-12-06 ENCOUNTER — Non-Acute Institutional Stay: Payer: Medicare HMO | Admitting: Adult Health

## 2018-12-06 ENCOUNTER — Encounter: Payer: Self-pay | Admitting: Adult Health

## 2018-12-06 DIAGNOSIS — F3340 Major depressive disorder, recurrent, in remission, unspecified: Secondary | ICD-10-CM | POA: Diagnosis not present

## 2018-12-06 DIAGNOSIS — I10 Essential (primary) hypertension: Secondary | ICD-10-CM

## 2018-12-06 NOTE — Progress Notes (Addendum)
[facility]:  [facility] Room Number: [phone] Place of Service:  ALF [phone]) Provider:  Kenard Gower, NP  Patient Care Team: [name], MD as PCP - General (Internal Medicine) Sharee Holster, NP as Nurse Practitioner (Geriatric Medicine)  Extended Emergency Contact Information Primary Emergency Contact: Murakami,[name] Address: [address]          [place], [place] [address] D[place] of [place] Home Phone: [phone] Mobile Phone: [phone] Relation: Spouse Secondary Emergency Contact: Lelon Perla [place] Home Phone: [phone] Work Phone: [phone] Mobile Phone: [phone] Relation: Niece  Code Status:  FULL  Goals of care: Advanced Directive information Advanced Directives 12/06/2018  Does Patient Have a Medical Advance Directive? No  Type of Advance Directive -  Does patient want to make changes to medical advance directive? No - Patient declined  Copy of Healthcare Power of Attorney in Chart? -  Would patient like information on creating a medical advance directive? -     Chief Complaint  Patient presents with  . Acute Visit     medication on backorder and being sleepy    HPI:  Pt is a 85 y.o. female seen today for medical management of GERD medication management. Pharmacy sent a note stating that Losartan is currently on manufacturer's back order and pharmacy does not have it at this time. She was seen in the room lying down on her bed. She sat up upon verbal greeting and asked what time was it. Informed her that it is almost lunch time. Charge nurse requested for Trazodone to be discontinued since resident noted to be sleeping a lot. She takes Trazodone and Lexapro for depression.   Past Medical History:  Diagnosis Date  . Allergy   . Anxiety   . Arthritis   . Asthma   . Baker's cyst    R knee  . Chest pain, unspecified 07/28/2013  . Coronary artery disease 07/28/2013  .  Coronary heart disease   . Dementia (HCC)   . Diverticulosis   . Encounter for Medicare annual wellness exam 09/29/2013  . Fibromyalgia   . Hematuria   . History of echocardiogram    EF > 55%, inf wall motion abnormalities, post MI  . Hyperlipidemia, unspecified    LDL 105 - Atorvostatin initiated 8/13, target LDL < 70  . Hypertension   . IBS (irritable bowel syndrome)   . Kidney stones   . Living will, counseling/discussion   . Mild dementia (HCC)   . Myocardial infarct (HCC)    ST elevation MI  . Nephrolithiasis    [name]  . Osteoporosis   . Stented coronary artery    RCA 05/10/12, x2 DES mid and prox RCA  . UTI (lower urinary tract infection)    Past Surgical History:  Procedure Laterality Date  . ANGIOPLASTY    . APPENDECTOMY    . BREAST BIOPSY     benign nodule  . BREAST SURGERY     bxs x 5  . CARDIAC CATHETERIZATION  05/09/2012   [facility]  . CHOLECYSTECTOMY    . COLONOSCOPY  08/12/2008   Pt declined repeat in 2014  . CORONARY ANGIOPLASTY WITH STENT PLACEMENT    . CYSTOSCOPY    . LITHOTRIPSY    . TONSILLECTOMY    . TOTAL ABDOMINAL HYSTERECTOMY W/ BILATERAL SALPINGOOPHORECTOMY    . URETEROSCOPY     with stent placement    Allergies  Allergen Reactions  . Epinephrine Other (See  Comments)    Rapid heart beat.  . Dtap-Hepatitis B Recomb-Ipv Other (See Comments)  . Levofloxacin Swelling    Edema in knee Edema in knee  . Metronidazole Nausea Only    Nausea and edema in knee Nausea and edema in knee  . Other     [place] - Other reaction(s): Unknown Sympathomimetic agents   . Penicillins   . Tetanus Toxoids     Arm swelling  . Amoxicillin-Pot Clavulanate Rash    Outpatient Encounter Medications as of 12/06/2018  Medication Sig  . acetaminophen (TYLENOL) 325 MG tablet Take 650 mg by mouth 2 (two) times daily.  Marland Kitchen aspirin 81 MG chewable tablet Chew 81 mg by mouth daily. With meal  . atorvastatin (LIPITOR) 20 MG tablet Take 20 mg by mouth daily. per  Alwyn Pea, MD  . Cholecalciferol (VITAMIN D3) 2000 units capsule Take 2,000 Units by mouth daily.  . cyanocobalamin 1000 MCG tablet Take 1,000 mcg by mouth daily.  . diclofenac sodium (VOLTAREN) 1 % GEL Apply 4 g topically 4 (four) times daily.   . divalproex (DEPAKOTE SPRINKLE) 125 MG capsule Take 375 mg by mouth 2 (two) times daily. 3 capsules to = 375 mg  . escitalopram (LEXAPRO) 10 MG tablet Take 10 mg by mouth daily.   . furosemide (LASIX) 20 MG tablet Take 20 mg by mouth daily as needed for edema. per Dr. Juliann Pares  . galantamine (RAZADYNE) 8 MG tablet Take 8 mg by mouth 2 (two) times daily. give at 8:30 AM and 5:30 PM  . losartan (COZAAR) 25 MG tablet Take 25 mg by mouth daily. per Dr. Juliann Pares  . memantine (NAMENDA XR) 14 MG CP24 24 hr capsule Take 14 mg by mouth daily. Take with meals  . nitroGLYCERIN (NITROSTAT) 0.4 MG SL tablet Place 1 tablet under the tongue every 5 (five) minutes as needed for chest pain.   . NON FORMULARY Diet -X Liquids , Diet: _X_ regular  . traZODone (DESYREL) 50 MG tablet Take 25 mg by mouth 2 (two) times daily. 0.5 tablet to = 25 mg   No facility-administered encounter medications on file as of 12/06/2018.     Review of Systems  GENERAL: No change in appetite, no fatigue, no weight changes, no fever, chills or weakness MOUTH and THROAT: Denies oral discomfort, gingival pain or bleeding, pain from teeth or hoarseness   RESPIRATORY: no cough, SOB, DOE, wheezing, hemoptysis CARDIAC: No chest pain, edema or palpitations GI: No abdominal pain, diarrhea, constipation, heart burn, nausea or vomiting NEUROLOGICAL: Denies dizziness, syncope, numbness, or headache PSYCHIATRIC: Denies feelings of depression or anxiety. No report of hallucinations, insomnia, paranoia, or agitation   Immunization History  Administered Date(s) Administered  . Influenza Whole 07/10/2013  . Influenza-Unspecified 06/05/2017, 06/27/2018  . PPD Test 10/14/2016  . Pneumococcal  Polysaccharide-23 08/31/2006   Pertinent  Health Maintenance Due  Topic Date Due  . DEXA SCAN  12/14/2018 (Originally 06/06/1998)  . PNA vac Low Risk Adult (2 of 2 - PCV13) 11/13/2019 (Originally 09/01/2007)  . INFLUENZA VACCINE  Completed   No flowsheet data found.   Vitals:   12/06/18 1158  BP: (!) 150/66  Pulse: 71  Resp: 18  Temp: (!) 97 F (36.1 C)  TempSrc: Oral  SpO2: 97%  Weight: 179 lb 8 oz (81.4 kg)  Height: 5\' 3"  (1.6 m)   Body mass index is 31.8 kg/m.  Physical Exam  GENERAL APPEARANCE: Well nourished. In no acute distress. Obese SKIN:  Skin  is warm and dry.  MOUTH and THROAT: Lips are without lesions. Oral mucosa is moist and without lesions. Tongue is normal in shape, size, and color and without lesions RESPIRATORY: Breathing is even & unlabored, BS CTAB CARDIAC: RRR, no murmur,no extra heart sounds, no edema GI: Abdomen soft, normal BS, no masses, no tenderness EXTREMITIES: Able to move X 4 extremities NEUROLOGICAL: There is no tremor. Speech is clear. Alert to self, disoriented to time and place. PSYCHIATRIC:  Affect and behavior are appropriate  Labs reviewed: Recent Labs    09/02/18 1345  NA 139  K 4.2  CL 104  CO2 28  GLUCOSE 121*  BUN 21  CREATININE 0.85  CALCIUM 8.5*   Recent Labs    09/02/18 1345  AST 26  ALT 18  ALKPHOS 60  BILITOT 0.6  PROT 6.6  ALBUMIN 3.8   Recent Labs    09/02/18 1345  WBC 7.1  HGB 13.3  HCT 42.0  MCV 95.0  PLT 174   Lab Results  Component Value Date   TSH 0.719 12/04/2017   No results found for: HGBA1C Lab Results  Component Value Date   CHOL 163 09/02/2018   HDL 52 09/02/2018   LDLCALC 50 09/02/2018   TRIG 307 (H) 09/02/2018   CHOLHDL 3.1 09/02/2018      Assessment/Plan  1. Essential hypertension - BP has been stable except for today 150/66 which is probably an outlier, will discontinue Losartan and start Olmesartan 20 mg 1 tab daily, BPR BID X 1 week  2. Major depressive disorder,  recurrent, in remission, unspecified (HCC) - denies being depressed, will discontinue Trazodone and continue Lexapro 10 mg 1 tab daily    Family/ staff Communication:  Discussed plan of care with resident.  Labs/tests ordered: None  Goals of care:  Long-term care   Kenard Gower, NP [facility] [phone] (Monday-Friday 8:00 a.m. - 5:00 p.m.) [phone] (after hours)

## 2018-12-11 ENCOUNTER — Encounter
Admission: RE | Admit: 2018-12-11 | Discharge: 2018-12-11 | Disposition: A | Discharge status: Home / Self Care | Payer: Private Health Insurance - Indemnity | Source: Ambulatory Visit | Attending: Internal Medicine | Admitting: Internal Medicine

## 2018-12-16 ENCOUNTER — Non-Acute Institutional Stay: Payer: Medicare HMO | Admitting: Adult Health

## 2018-12-16 ENCOUNTER — Encounter: Payer: Self-pay | Admitting: Adult Health

## 2018-12-16 DIAGNOSIS — F39 Unspecified mood [affective] disorder: Secondary | ICD-10-CM

## 2018-12-16 DIAGNOSIS — Z8659 Personal history of other mental and behavioral disorders: Secondary | ICD-10-CM

## 2018-12-16 NOTE — Progress Notes (Signed)
[facility]:  [facility] Room Number: [phone] Place of Service:  ALF [phone]) Provider:  Kenard Gower, NP  Patient Care Team: [name], MD as PCP - General (Internal Medicine) Sharee Holster, NP as Nurse Practitioner (Geriatric Medicine)  Extended Emergency Contact Information Primary Emergency Contact: Sara Stout Address: [address]          [place], [place] [address] [place] of [place] Home Phone: [phone] Mobile Phone: [phone] Relation: Spouse Secondary Emergency Contact: Sara Stout [place] Home Phone: [phone] Work Phone: [phone] Mobile Phone: [phone] Relation: Niece  Code Status:  FULL CODE  Goals of care: Advanced Directive information Advanced Directives 12/06/2018  Does Patient Have a Medical Advance Directive? No  Type of Advance Directive -  Does patient want to make changes to medical advance directive? No - Patient declined  Copy of Healthcare Power of Attorney in Chart? -  Would patient like information on creating a medical advance directive? -     Chief Complaint  Patient presents with  . Acute Visit    Episode of shaking/shivering and sweating    HPI:  Pt is a 85 y.o. female seen today for an acute visit. Charge nurse reported that she got out of her room this morning shaking and sweating. She verbalized that she doesn't feel good. No reported fever. She was seen in her room and verbalized not remembering what had happened this morning. She was seen sitting on her recliner and was pleasantly confused. She denies having any pain nor dysuria.    Past Medical History:  Diagnosis Date  . Allergy   . Anxiety   . Arthritis   . Asthma   . Baker's cyst    R knee  . Chest pain, unspecified 07/28/2013  . Coronary artery disease 07/28/2013  . Coronary heart disease   . Dementia (HCC)   . Diverticulosis   . Encounter for Medicare annual wellness exam  09/29/2013  . Fibromyalgia   . Hematuria   . History of echocardiogram    EF > 55%, inf wall motion abnormalities, post MI  . Hyperlipidemia, unspecified    LDL 105 - Atorvostatin initiated 8/13, target LDL < 70  . Hypertension   . IBS (irritable bowel syndrome)   . Kidney stones   . Living will, counseling/discussion   . Mild dementia (HCC)   . Myocardial infarct (HCC)    ST elevation MI  . Nephrolithiasis    [name]  . Osteoporosis   . Stented coronary artery    RCA 05/10/12, x2 DES mid and prox RCA  . UTI (lower urinary tract infection)    Past Surgical History:  Procedure Laterality Date  . ANGIOPLASTY    . APPENDECTOMY    . BREAST BIOPSY     benign nodule  . BREAST SURGERY     bxs x 5  . CARDIAC CATHETERIZATION  05/09/2012   [facility]  . CHOLECYSTECTOMY    . COLONOSCOPY  08/12/2008   Pt declined repeat in 2014  . CORONARY ANGIOPLASTY WITH STENT PLACEMENT    . CYSTOSCOPY    . LITHOTRIPSY    . TONSILLECTOMY    . TOTAL ABDOMINAL HYSTERECTOMY W/ BILATERAL SALPINGOOPHORECTOMY    . URETEROSCOPY     with stent placement    Allergies  Allergen Reactions  . Epinephrine Other (See Comments)    Rapid heart beat.  . Dtap-Hepatitis B Recomb-Ipv Other (See Comments)  . Levofloxacin Swelling  Edema in knee Edema in knee  . Metronidazole Nausea Only    Nausea and edema in knee Nausea and edema in knee  . Other     Sara Stout - Other reaction(s): Unknown Sympathomimetic agents   . Penicillins   . Tetanus Toxoids     Arm swelling  . Amoxicillin-Pot Clavulanate Rash    Outpatient Encounter Medications as of 12/16/2018  Medication Sig  . acetaminophen (TYLENOL) 325 MG tablet Take 650 mg by mouth 2 (two) times daily.  Marland Kitchen aspirin 81 MG chewable tablet Chew 81 mg by mouth daily. With meal  . atorvastatin (LIPITOR) 20 MG tablet Take 20 mg by mouth daily. per Alwyn Pea, MD  . Cholecalciferol (VITAMIN D3) 2000 units capsule Take 2,000 Units by mouth daily.  .  cyanocobalamin 1000 MCG tablet Take 1,000 mcg by mouth daily.  . diclofenac sodium (VOLTAREN) 1 % GEL Apply 4 g topically 4 (four) times daily.   . divalproex (DEPAKOTE SPRINKLE) 125 MG capsule Take 375 mg by mouth 2 (two) times daily. 3 capsules to = 375 mg  . escitalopram (LEXAPRO) 10 MG tablet Take 10 mg by mouth daily.   . furosemide (LASIX) 20 MG tablet Take 20 mg by mouth daily as needed for edema. per Dr. Juliann Pares  . galantamine (RAZADYNE) 8 MG tablet Take 8 mg by mouth 2 (two) times daily. give at 8:30 AM and 5:30 PM  . losartan (COZAAR) 25 MG tablet Take 25 mg by mouth daily. per Dr. Juliann Pares  . memantine (NAMENDA XR) 14 MG CP24 24 hr capsule Take 14 mg by mouth daily. Take with meals  . nitroGLYCERIN (NITROSTAT) 0.4 MG SL tablet Place 1 tablet under the tongue every 5 (five) minutes as needed for chest pain.   . NON FORMULARY Diet -X Liquids , Diet: _X_ regular  . traZODone (DESYREL) 50 MG tablet Take 25 mg by mouth 2 (two) times daily. 0.5 tablet to = 25 mg   No facility-administered encounter medications on file as of 12/16/2018.     Review of Systems  GENERAL: No change in appetite, no fatigue, no weight changes, no fever, chills or weakness MOUTH and THROAT: Denies oral discomfort, gingival pain or bleeding RESPIRATORY: no cough, SOB, DOE, wheezing, hemoptysis CARDIAC: No chest pain, or palpitations GI: No abdominal pain, diarrhea, constipation, heart burn, nausea or vomiting GU: Denies dysuria, frequency, hematuria, or discharge NEUROLOGICAL: Denies dizziness, syncope, numbness, or headache PSYCHIATRIC: Denies feelings of depression or anxiety. No report of hallucinations, insomnia, paranoia, or agitation   Immunization History  Administered Date(s) Administered  . Influenza Whole 07/10/2013  . Influenza-Unspecified 06/05/2017, 06/27/2018  . PPD Test 10/14/2016  . Pneumococcal Polysaccharide-23 08/31/2006   Pertinent  Health Maintenance Due  Topic Date Due  . DEXA  SCAN  06/06/1998  . PNA vac Low Risk Adult (2 of 2 - PCV13) 11/13/2019 (Originally 09/01/2007)  . INFLUENZA VACCINE  04/12/2019     Vitals:   12/16/18 1505  BP: (!) 124/57  Pulse: 85  Resp: 18  Temp: 97.6 F (36.4 C)  SpO2: 96%  Weight: 177 lb 6.4 oz (80.5 kg)  Height: 5\' 3"  (1.6 m)   Body mass index is 31.42 kg/m.  Physical Exam  GENERAL APPEARANCE: Well nourished. In no acute distress. Obese SKIN:  Skin is warm and dry.  MOUTH and THROAT: Lips are without lesions. Oral mucosa is moist and without lesions. Tongue is normal in shape, size, and color and without lesions RESPIRATORY: Breathing is  even & unlabored, BS CTAB CARDIAC: RRR, no murmur,no extra heart sounds, BLE trace edema GI: Abdomen soft, normal BS, no masses, no tenderness EXTREMITIES:  Able to move X 4 extremities NEUROLOGICAL: There is no tremor. Speech is clear. Alert to self, disoriented to time and place. PSYCHIATRIC: Affect and behavior are appropriate  Labs reviewed: Recent Labs    09/02/18 1345  NA 139  K 4.2  CL 104  CO2 28  GLUCOSE 121*  BUN 21  CREATININE 0.85  CALCIUM 8.5*   Recent Labs    09/02/18 1345  AST 26  ALT 18  ALKPHOS 60  BILITOT 0.6  PROT 6.6  ALBUMIN 3.8   Recent Labs    09/02/18 1345  WBC 7.1  HGB 13.3  HCT 42.0  MCV 95.0  PLT 174   Lab Results  Component Value Date   TSH 0.719 12/04/2017   No results found for: HGBA1C Lab Results  Component Value Date   CHOL 163 09/02/2018   HDL 52 09/02/2018   LDLCALC 50 09/02/2018   TRIG 307 (H) 09/02/2018   CHOLHDL 3.1 09/02/2018     Assessment/Plan  1. Confusion, hx of, without neuro findings - will check for UTI, no fever, will check CBC and BMP  2. Mood disorder (HCC) Mood is stable, wil continue divalproex DR sprinkles 125 mg 3 capsules = 375 mg twice a day, check Depakote level   Family/ staff Communication: Discussed plan of care with resident and charge nurse.  Labs/tests ordered: CBC, BMP,  Depakote level and urinalysis with culture and sensitivity  Goals of care:   Long-term care   Kenard GowerMonina Medina-Vargas, NP [facility] [phone] (Monday-Friday 8:00 a.m. - 5:00 p.m.) [phone] (after hours)

## 2018-12-17 ENCOUNTER — Other Ambulatory Visit
Admission: RE | Admit: 2018-12-17 | Discharge: 2018-12-17 | Disposition: A | Discharge status: Home / Self Care | Payer: Medicare HMO | Source: Ambulatory Visit | Attending: Adult Health | Admitting: Adult Health

## 2018-12-17 DIAGNOSIS — F39 Unspecified mood [affective] disorder: Secondary | ICD-10-CM | POA: Diagnosis present

## 2018-12-17 LAB — CBC WITH DIFFERENTIAL/PLATELET
Abs Immature Granulocytes: 0.03 10*3/uL (ref 0.00–0.07)
Basophils Absolute: 0 10*3/uL (ref 0.0–0.1)
Basophils Relative: 1 %
Eosinophils Absolute: 0.2 10*3/uL (ref 0.0–0.5)
Eosinophils Relative: 3 %
HCT: 40.7 % (ref 36.0–46.0)
Hemoglobin: 13 g/dL (ref 12.0–15.0)
Immature Granulocytes: 1 %
Lymphocytes Relative: 26 %
Lymphs Abs: 1.7 10*3/uL (ref 0.7–4.0)
MCH: 29.7 pg (ref 26.0–34.0)
MCHC: 31.9 g/dL (ref 30.0–36.0)
MCV: 93.1 fL (ref 80.0–100.0)
Monocytes Absolute: 0.5 10*3/uL (ref 0.1–1.0)
Monocytes Relative: 8 %
Neutro Abs: 4 10*3/uL (ref 1.7–7.7)
Neutrophils Relative %: 61 %
Platelets: 185 10*3/uL (ref 150–400)
RBC: 4.37 MIL/uL (ref 3.87–5.11)
RDW: 13.2 % (ref 11.5–15.5)
WBC: 6.4 10*3/uL (ref 4.0–10.5)
nRBC: 0 % (ref 0.0–0.2)

## 2018-12-17 LAB — URINALYSIS, ROUTINE W REFLEX MICROSCOPIC
Bilirubin Urine: NEGATIVE
Glucose, UA: NEGATIVE mg/dL
Hgb urine dipstick: NEGATIVE
Ketones, ur: NEGATIVE mg/dL
Nitrite: NEGATIVE
Protein, ur: NEGATIVE mg/dL
Specific Gravity, Urine: 1.013 (ref 1.005–1.030)
pH: 6 (ref 5.0–8.0)

## 2018-12-17 LAB — BASIC METABOLIC PANEL
Anion gap: 8 (ref 5–15)
BUN: 19 mg/dL (ref 8–23)
CO2: 27 mmol/L (ref 22–32)
Calcium: 8.7 mg/dL — ABNORMAL LOW (ref 8.9–10.3)
Chloride: 100 mmol/L (ref 98–111)
Creatinine, Ser: 0.73 mg/dL (ref 0.44–1.00)
GFR calc Af Amer: >60 mL/min (ref >60)
GFR calc non Af Amer: >60 mL/min (ref >60)
Glucose, Bld: 118 mg/dL — ABNORMAL HIGH (ref 70–99)
Potassium: 4.1 mmol/L (ref 3.5–5.1)
Sodium: 135 mmol/L (ref 135–145)

## 2018-12-18 LAB — URINE CULTURE

## 2018-12-19 ENCOUNTER — Other Ambulatory Visit
Admission: RE | Admit: 2018-12-19 | Discharge: 2018-12-19 | Disposition: A | Discharge status: Home / Self Care | Payer: Medicare HMO | Source: Skilled Nursing Facility | Attending: Adult Health | Admitting: Adult Health

## 2018-12-19 DIAGNOSIS — F39 Unspecified mood [affective] disorder: Secondary | ICD-10-CM | POA: Insufficient documentation

## 2018-12-19 LAB — VALPROIC ACID LEVEL: Valproic Acid Lvl: 29 ug/mL — ABNORMAL LOW (ref 50.0–100.0)

## 2019-01-10 ENCOUNTER — Encounter
Admission: RE | Admit: 2019-01-10 | Discharge: 2019-01-10 | Disposition: A | Discharge status: Home / Self Care | Payer: Private Health Insurance - Indemnity | Source: Ambulatory Visit | Attending: Internal Medicine | Admitting: Internal Medicine

## 2019-01-23 ENCOUNTER — Non-Acute Institutional Stay: Payer: Medicare HMO | Admitting: Adult Health

## 2019-01-23 ENCOUNTER — Encounter: Payer: Self-pay | Admitting: Adult Health

## 2019-01-23 DIAGNOSIS — I10 Essential (primary) hypertension: Secondary | ICD-10-CM | POA: Diagnosis not present

## 2019-01-23 DIAGNOSIS — F0281 Dementia in other diseases classified elsewhere with behavioral disturbance: Secondary | ICD-10-CM

## 2019-01-23 DIAGNOSIS — F39 Unspecified mood [affective] disorder: Secondary | ICD-10-CM

## 2019-01-23 DIAGNOSIS — M159 Polyosteoarthritis, unspecified: Secondary | ICD-10-CM

## 2019-01-23 DIAGNOSIS — M150 Primary generalized (osteo)arthritis: Secondary | ICD-10-CM

## 2019-01-23 DIAGNOSIS — E538 Deficiency of other specified B group vitamins: Secondary | ICD-10-CM

## 2019-01-23 DIAGNOSIS — F02818 Dementia in other diseases classified elsewhere, unspecified severity, with other behavioral disturbance: Secondary | ICD-10-CM

## 2019-01-23 DIAGNOSIS — I25118 Atherosclerotic heart disease of native coronary artery with other forms of angina pectoris: Secondary | ICD-10-CM

## 2019-01-23 DIAGNOSIS — F3340 Major depressive disorder, recurrent, in remission, unspecified: Secondary | ICD-10-CM

## 2019-01-23 DIAGNOSIS — G301 Alzheimer's disease with late onset: Secondary | ICD-10-CM

## 2019-01-23 NOTE — Progress Notes (Signed)
[facility]  [facility] Room Number: 247 Place of Service:  ALF [phone]) Provider:  Kenard Gower, DNP, FNP-BC  Patient Care Team: [name], MD as PCP - General (Internal Medicine) Sharee Holster, NP as Nurse Practitioner (Geriatric Medicine)  Extended Emergency Contact Information Primary Emergency Contact: Covelli,[name] Address: [address]          [place], [place] [address] [place] of [place] Home Phone: [phone] Mobile Phone: [phone] Relation: Spouse Secondary Emergency Contact: Lelon Perla [place] of [place] Home Phone: [phone] Work Phone: [phone] Mobile Phone: [phone] Relation: Niece  Code Status:  Full Code  Goals of care: Advanced Directive information Advanced Directives 12/06/2018  Does Patient Have a Medical Advance Directive? No  Type of Advance Directive -  Does patient want to make changes to medical advance directive? No - Patient declined  Copy of Healthcare Power of Attorney in Chart? -  Would patient like information on creating a medical advance directive? -     Chief Complaint  Patient presents with  . Medical Management of Chronic Issues    Routine [facility]F visit    HPI:  Pt is an 85 y.o. female seen today for medical management of chronic diseases.  She is a long-term care resident of [facility]. She has a PMH of Alzheimer's disease, MI, HLD, anxiety disorder, depression, and atherosclerotic heart disease. She was seen in the room today. She was seen sitting on the edge of her bed. She said that her family calls her "Polly". She denies having chest pain. BPs 121/61, 124/57, 132/66 and 137/69. Her mood has been stable. She is friendly and denies depression.   Past Medical History:  Diagnosis Date  . Allergy   . Anxiety   . Arthritis   . Asthma   . Baker's cyst    R knee  . Chest pain, unspecified 07/28/2013  . Coronary artery disease 07/28/2013  .  Coronary heart disease   . Dementia (HCC)   . Diverticulosis   . Encounter for Medicare annual wellness exam 09/29/2013  . Fibromyalgia   . Hematuria   . History of echocardiogram    EF > 55%, inf wall motion abnormalities, post MI  . Hyperlipidemia, unspecified    LDL 105 - Atorvostatin initiated 8/13, target LDL < 70  . Hypertension   . IBS (irritable bowel syndrome)   . Kidney stones   . Living will, counseling/discussion   . Mild dementia (HCC)   . Myocardial infarct (HCC)    ST elevation MI  . Nephrolithiasis    [name]  . Osteoporosis   . Stented coronary artery    RCA 05/10/12, x2 DES mid and prox RCA  . UTI (lower urinary tract infection)    Past Surgical History:  Procedure Laterality Date  . ANGIOPLASTY    . APPENDECTOMY    . BREAST BIOPSY     benign nodule  . BREAST SURGERY     bxs x 5  . CARDIAC CATHETERIZATION  05/09/2012   [facility]  . CHOLECYSTECTOMY    . COLONOSCOPY  08/12/2008   Pt declined repeat in 2014  . CORONARY ANGIOPLASTY WITH STENT PLACEMENT    . CYSTOSCOPY    . LITHOTRIPSY    . TONSILLECTOMY    . TOTAL ABDOMINAL HYSTERECTOMY W/ BILATERAL SALPINGOOPHORECTOMY    . URETEROSCOPY     with stent placement    Allergies  Allergen Reactions  . Epinephrine Other (See Comments)  Rapid heart beat.  . Dtap-Hepatitis B Recomb-Ipv Other (See Comments)  . Levofloxacin Swelling    Edema in knee Edema in knee  . Metronidazole Nausea Only    Nausea and edema in knee Nausea and edema in knee  . Other     Green Peppers - Other reaction(s): Unknown Sympathomimetic agents   . Penicillins   . Tetanus Toxoids     Arm swelling  . Amoxicillin-Pot Clavulanate Rash    Outpatient Encounter Medications as of 01/23/2019  Medication Sig  . acetaminophen (TYLENOL) 325 MG tablet Take 650 mg by mouth 2 (two) times daily.  Marland Kitchen. aspirin 81 MG chewable tablet Chew 81 mg by mouth daily. With meal  . atorvastatin (LIPITOR) 20 MG tablet Take 20 mg by mouth daily. per  Alwyn Peawayne D Callwood, MD  . Cholecalciferol (VITAMIN D3) 2000 units capsule Take 2,000 Units by mouth daily.  . cyanocobalamin 1000 MCG tablet Take 1,000 mcg by mouth daily.  . diclofenac sodium (VOLTAREN) 1 % GEL Apply 4 g topically 4 (four) times daily.   . divalproex (DEPAKOTE SPRINKLE) 125 MG capsule Take 375 mg by mouth 2 (two) times daily. 3 capsules to = 375 mg  . escitalopram (LEXAPRO) 10 MG tablet Take 10 mg by mouth daily.   . furosemide (LASIX) 20 MG tablet Take 20 mg by mouth daily as needed for edema. per Dr. Juliann Paresallwood  . galantamine (RAZADYNE) 8 MG tablet Take 8 mg by mouth 2 (two) times daily. give at 8:30 AM and 5:30 PM  . memantine (NAMENDA XR) 14 MG CP24 24 hr capsule Take 14 mg by mouth daily. Take with meals  . nitroGLYCERIN (NITROSTAT) 0.4 MG SL tablet Place 1 tablet under the tongue every 5 (five) minutes as needed for chest pain.   . NON FORMULARY Diet -X Liquids , Diet: _X_ regular  . olmesartan (BENICAR) 20 MG tablet Take 20 mg by mouth daily.  . [DISCONTINUED] losartan (COZAAR) 25 MG tablet Take 25 mg by mouth daily. per Dr. Juliann Paresallwood  . [DISCONTINUED] traZODone (DESYREL) 50 MG tablet Take 25 mg by mouth 2 (two) times daily. 0.5 tablet to = 25 mg   No facility-administered encounter medications on file as of 01/23/2019.     Review of Systems  GENERAL: No change in appetite, no fatigue, no weight changes, no fever, chills or weakness MOUTH and THROAT: Denies oral discomfort, gingival pain or bleeding RESPIRATORY: no cough, SOB, DOE, wheezing, hemoptysis CARDIAC: No chest pain, edema or palpitations GI: No abdominal pain, diarrhea, constipation, heart burn, nausea or vomiting GU: Denies dysuria, frequency, hematuria, incontinence, or discharge MUSCULOSKELETAL: Denies joint pain, muscle pain, back pain NEUROLOGICAL: Denies dizziness, syncope, numbness, or headache PSYCHIATRIC: Denies feelings of depression or anxiety. No report of hallucinations, insomnia, paranoia, or  agitation   Immunization History  Administered Date(s) Administered  . Influenza Whole 07/10/2013  . Influenza-Unspecified 06/05/2017, 06/27/2018  . PPD Test 10/14/2016  . Pneumococcal Polysaccharide-23 08/31/2006   Pertinent  Health Maintenance Due  Topic Date Due  . DEXA SCAN  06/06/1998  . PNA vac Low Risk Adult (2 of 2 - PCV13) 11/13/2019 (Originally 09/01/2007)  . INFLUENZA VACCINE  04/12/2019    Vitals:   01/23/19 1454  BP: 121/61  Pulse: 73  Resp: 18  Temp: (!) 97.4 F (36.3 C)  TempSrc: Oral  SpO2: 97%  Weight: 175 lb 9.6 oz (79.7 kg)  Height: 5\' 3"  (1.6 m)   Body mass index is 31.11 kg/m.  Physical Exam  GENERAL APPEARANCE: Well nourished. In no acute distress. Obese SKIN:  Skin is warm and dry.  MOUTH and THROAT: Lips are without lesions. Oral mucosa is moist and without lesions. Tongue is normal in shape, size, and color and without lesions RESPIRATORY: Breathing is even & unlabored, BS CTAB CARDIAC: RRR, no murmur,no extra heart sounds, no edema GI: Abdomen soft, normal BS, no masses, no tenderness EXTREMITIES:  Able to move X 4 extremities NEUROLOGICAL: There is no tremor. Speech is clear. Alert to self, disoriented to time and place. PSYCHIATRIC:  Affect and behavior are appropriate  Labs reviewed: Recent Labs    09/02/18 1345 12/17/18 1310  NA 139 135  K 4.2 4.1  CL 104 100  CO2 28 27  GLUCOSE 121* 118*  BUN 21 19  CREATININE 0.85 0.73  CALCIUM 8.5* 8.7*   Recent Labs    09/02/18 1345  AST 26  ALT 18  ALKPHOS 60  BILITOT 0.6  PROT 6.6  ALBUMIN 3.8   Recent Labs    09/02/18 1345 12/17/18 1310  WBC 7.1 6.4  NEUTROABS  --  4.0  HGB 13.3 13.0  HCT 42.0 40.7  MCV 95.0 93.1  PLT 174 185   Lab Results  Component Value Date   TSH 0.719 12/04/2017    Lab Results  Component Value Date   CHOL 163 09/02/2018   HDL 52 09/02/2018   LDLCALC 50 09/02/2018   TRIG 307 (H) 09/02/2018   CHOLHDL 3.1 09/02/2018     Assessment/Plan   1. Atherosclerosis of native coronary artery of native heart with other form of angina pectoris (HCC) -Denies chest pain, continue aspirin milligrams 1 tab daily, NTG as needed and atorvastatin 20 mg 1 tab daily  2. Essential hypertension -Well-controlled, continue olmesartan 20 mg 1 tab daily  3. Primary osteoarthritis involving multiple joints -Stable, continue Voltaren 1% gel topically 4 times a day  4. Vitamin B12 deficiency Lab Results  Component Value Date   VITAMINB12 960 (H) 12/04/2017  -Continue vitamin B12 1000 mcg 1 tab daily   5. Mood disorder (HCC) -Mood is a stable, continue Depakote sprinkles 125 mg 3 capsules = 375 mg twice daily  6. Late onset Alzheimer's disease with behavioral disturbance (HCC) -Continue galantamine 8 mg 1 tab twice a day      Family/ staff Communication: Discussed plan of care with resident.  Labs/tests ordered:  None  Goals of care:   Long-term ALF care.   Kenard Gower, DNP, FNP-BC [facility] and Adult Medicine [phone] (Monday-Friday 8:00 a.m. - 5:00 p.m.) [phone] (after hours)

## 2019-01-27 ENCOUNTER — Non-Acute Institutional Stay: Payer: Medicare HMO | Admitting: Adult Health

## 2019-01-27 ENCOUNTER — Encounter: Payer: Self-pay | Admitting: Adult Health

## 2019-01-27 DIAGNOSIS — F419 Anxiety disorder, unspecified: Secondary | ICD-10-CM | POA: Diagnosis not present

## 2019-01-27 DIAGNOSIS — F39 Unspecified mood [affective] disorder: Secondary | ICD-10-CM | POA: Diagnosis not present

## 2019-01-27 DIAGNOSIS — M722 Plantar fascial fibromatosis: Secondary | ICD-10-CM | POA: Diagnosis not present

## 2019-01-27 MED ORDER — LORAZEPAM 0.5 MG PO TABS: 0.5000 mg | ORAL_TABLET | Freq: Every day | ORAL | 0 refills | Status: DC | PRN

## 2019-01-27 NOTE — Progress Notes (Signed)
[facility]:  [facility] Room Number: 247-P Place of Service:  ALF [phone]) Provider:  Kenard Gower, DNP, FNP-BC  Patient Care Team: [name], MD as PCP - General (Internal Medicine) Sharee Holster, NP as Nurse Practitioner (Geriatric Medicine)  Extended Emergency Contact Information Primary Emergency Contact: Martindelcampo,[name] Address: [address]          [place], [place] [address] [place] of [place] Home Phone: [phone] Mobile Phone: [phone] Relation: Spouse Secondary Emergency Contact: Lelon Perla [place] Home Phone: [phone] Work Phone: [phone] Mobile Phone: [phone] Relation: Niece  Code Status:  Full Code  Goals of care: Advanced Directive information Advanced Directives 12/06/2018  Does Patient Have a Medical Advance Directive? No  Type of Advance Directive -  Does patient want to make changes to medical advance directive? No - Patient declined  Copy of Healthcare Power of Attorney in Chart? -  Would patient like information on creating a medical advance directive? -     Chief Complaint  Patient presents with  . Acute Visit    Anxiety    HPI:  Pt is a 85 y.o. female seen today for acute visit secondary to anxiety. She is a long-term ALF resident of [facility]. She has a PMH of Alzheimer's disease, MI, HLD, anxiety disorder, depression, and atherosclerotic heart disease. Charge reported that resident was noted to be anxious this morning and was had a blank stare. She had a PRN Ativan that expired. She was seen in the room today. She said that she is "scared of what people might think of me", "where is her daddy (husband)", and "do they know I'm here?" She has a brace on right foot for plantar fasciitis but brace can't be found anywhere. Staff believes that resident hides them. She walks without difficulty.    Past Medical History:  Diagnosis Date  . Allergy   . Anxiety   .  Arthritis   . Asthma   . Baker's cyst    R knee  . Chest pain, unspecified 07/28/2013  . Coronary artery disease 07/28/2013  . Coronary heart disease   . Dementia (HCC)   . Diverticulosis   . Encounter for Medicare annual wellness exam 09/29/2013  . Fibromyalgia   . Hematuria   . History of echocardiogram    EF > 55%, inf wall motion abnormalities, post MI  . Hyperlipidemia, unspecified    LDL 105 - Atorvostatin initiated 8/13, target LDL < 70  . Hypertension   . IBS (irritable bowel syndrome)   . Kidney stones   . Living will, counseling/discussion   . Mild dementia (HCC)   . Myocardial infarct (HCC)    ST elevation MI  . Nephrolithiasis    [name]  . Osteoporosis   . Stented coronary artery    RCA 05/10/12, x2 DES mid and prox RCA  . UTI (lower urinary tract infection)    Past Surgical History:  Procedure Laterality Date  . ANGIOPLASTY    . APPENDECTOMY    . BREAST BIOPSY     benign nodule  . BREAST SURGERY     bxs x 5  . CARDIAC CATHETERIZATION  05/09/2012   [facility]  . CHOLECYSTECTOMY    . COLONOSCOPY  08/12/2008   Pt declined repeat in 2014  . CORONARY ANGIOPLASTY WITH STENT PLACEMENT    . CYSTOSCOPY    . LITHOTRIPSY    . TONSILLECTOMY    . TOTAL ABDOMINAL HYSTERECTOMY W/ BILATERAL  SALPINGOOPHORECTOMY    . URETEROSCOPY     with stent placement    Allergies  Allergen Reactions  . Epinephrine Other (See Comments)    Rapid heart beat.  . Dtap-Hepatitis B Recomb-Ipv Other (See Comments)  . Levofloxacin Swelling    Edema in knee Edema in knee  . Metronidazole Nausea Only    Nausea and edema in knee Nausea and edema in knee  . Other     Green Peppers - Other reaction(s): Unknown Sympathomimetic agents   . Penicillins   . Tetanus Toxoids     Arm swelling  . Amoxicillin-Pot Clavulanate Rash    Outpatient Encounter Medications as of 01/27/2019  Medication Sig  . acetaminophen (TYLENOL) 325 MG tablet Take 650 mg by mouth 2 (two) times daily.  Marland Kitchen. aspirin  81 MG chewable tablet Chew 81 mg by mouth daily. With meal  . atorvastatin (LIPITOR) 20 MG tablet Take 20 mg by mouth daily. per Alwyn Peawayne D Callwood, MD  . Cholecalciferol (VITAMIN D3) 2000 units capsule Take 2,000 Units by mouth daily.  . cyanocobalamin 1000 MCG tablet Take 1,000 mcg by mouth daily.  . diclofenac sodium (VOLTAREN) 1 % GEL Apply 4 g topically 4 (four) times daily.   . divalproex (DEPAKOTE SPRINKLE) 125 MG capsule Take 375 mg by mouth 2 (two) times daily. 3 capsules to = 375 mg  . escitalopram (LEXAPRO) 10 MG tablet Take 10 mg by mouth daily.   . furosemide (LASIX) 20 MG tablet Take 20 mg by mouth daily as needed for edema. per Dr. Juliann Paresallwood  . galantamine (RAZADYNE) 8 MG tablet Take 8 mg by mouth 2 (two) times daily. give at 8:30 AM and 5:30 PM  . LORazepam (ATIVAN) 0.5 MG tablet Take 0.5 mg by mouth daily as needed for anxiety.  . memantine (NAMENDA XR) 14 MG CP24 24 hr capsule Take 14 mg by mouth daily. Take with meals  . nitroGLYCERIN (NITROSTAT) 0.4 MG SL tablet Place 1 tablet under the tongue every 5 (five) minutes as needed for chest pain.   . NON FORMULARY Diet -X Liquids , Diet: _X_ regular  . olmesartan (BENICAR) 20 MG tablet Take 20 mg by mouth daily.   No facility-administered encounter medications on file as of 01/27/2019.     Review of Systems  GENERAL: No change in appetite, no fatigue, no weight changes, no fever, chills or weakness MOUTH and THROAT: Denies oral discomfort, gingival pain or bleeding RESPIRATORY: no cough, SOB, DOE, wheezing, hemoptysis CARDIAC: No chest pain, edema or palpitations GI: No abdominal pain, diarrhea, constipation, heart burn, nausea or vomiting GU: Denies dysuria, frequency, hematuria, or discharge NEUROLOGICAL: Denies dizziness, syncope, numbness, or headache PSYCHIATRIC: +anxiety    Immunization History  Administered Date(s) Administered  . Influenza Whole 07/10/2013  . Influenza-Unspecified 06/05/2017, 06/27/2018  . PPD  Test 10/14/2016  . Pneumococcal Polysaccharide-23 08/31/2006   Pertinent  Health Maintenance Due  Topic Date Due  . DEXA SCAN  06/06/1998  . PNA vac Low Risk Adult (2 of 2 - PCV13) 11/13/2019 (Originally 09/01/2007)  . INFLUENZA VACCINE  04/12/2019    Vitals:   01/27/19 1456  BP: 121/61  Pulse: 73  Resp: 18  Temp: 98.1 F (36.7 C)  TempSrc: Oral  SpO2: 97%  Weight: 175 lb 9.6 oz (79.7 kg)  Height: 5\' 3"  (1.6 m)   Body mass index is 31.11 kg/m.  Physical Exam  GENERAL APPEARANCE: Well nourished. In no acute distress. Obese SKIN:  Skin is warm and dry.  MOUTH and THROAT: Lips are without lesions. Oral mucosa is moist and without lesions. Tongue is normal in shape, size, and color and without lesions RESPIRATORY: Breathing is even & unlabored, BS CTAB CARDIAC: RRR, no murmur,no extra heart sounds, no edema GI: Abdomen soft, normal BS, no masses, no tenderness EXTREMITIES:  Able  to move X 4 extremities NEUROLOGICAL: There is no tremor. Speech is clear.. Alert to self, disoriented to time and place. PSYCHIATRIC:  Affect and behavior are appropriate  Labs reviewed: Recent Labs    09/02/18 1345 12/17/18 1310  NA 139 135  K 4.2 4.1  CL 104 100  CO2 28 27  GLUCOSE 121* 118*  BUN 21 19  CREATININE 0.85 0.73  CALCIUM 8.5* 8.7*   Recent Labs    09/02/18 1345  AST 26  ALT 18  ALKPHOS 60  BILITOT 0.6  PROT 6.6  ALBUMIN 3.8   Recent Labs    09/02/18 1345 12/17/18 1310  WBC 7.1 6.4  NEUTROABS  --  4.0  HGB 13.3 13.0  HCT 42.0 40.7  MCV 95.0 93.1  PLT 174 185   Lab Results  Component Value Date   TSH 0.719 12/04/2017    Lab Results  Component Value Date   CHOL 163 09/02/2018   HDL 52 09/02/2018   LDLCALC 50 09/02/2018   TRIG 307 (H) 09/02/2018   CHOLHDL 3.1 09/02/2018    Assessment/Plan  1. Anxiety - continue to monitor behavior and start Ativan PRN - LORazepam (ATIVAN) 0.5 MG tablet; Take 1 tablet (0.5 mg total) by mouth daily as needed for  up to 14 days for anxiety.  Dispense: 14 tablet; Refill: 0  2. Mood disorder (HCC) -Continue divalproex sprinkles 125 mg 3 capsules = 375 mg twice a day  3. Plantar fasciitis - will discontinue right foot brace and monitor for pain and gait instability    Family/ staff Communication: Discussed plan of care with resident and charge nurse.  Labs/tests ordered:  None  Goals of care:   Long-term ALF care.   Kenard Gower, DNP, FNP-BC [facility] and Adult Medicine [phone] (Monday-Friday 8:00 a.m. - 5:00 p.m.) [phone] (after hours)

## 2019-01-30 ENCOUNTER — Other Ambulatory Visit: Payer: Self-pay | Admitting: Adult Health

## 2019-01-30 DIAGNOSIS — F419 Anxiety disorder, unspecified: Secondary | ICD-10-CM

## 2019-01-30 MED ORDER — LORAZEPAM 0.5 MG PO TABS: 0.5000 mg | ORAL_TABLET | Freq: Every day | ORAL | 0 refills | Status: DC | PRN

## 2019-01-30 NOTE — Telephone Encounter (Signed)
This encounter was created in error - please disregard.

## 2019-01-30 NOTE — Telephone Encounter (Signed)
Request completed, pended for DNP to approve and transmit to [facility] (formerly [facility]).

## 2019-02-10 ENCOUNTER — Encounter
Admission: RE | Admit: 2019-02-10 | Discharge: 2019-02-10 | Disposition: A | Discharge status: Home / Self Care | Payer: Private Health Insurance - Indemnity | Source: Ambulatory Visit | Attending: Internal Medicine | Admitting: Internal Medicine

## 2019-02-13 ENCOUNTER — Other Ambulatory Visit: Payer: Self-pay | Admitting: Adult Health

## 2019-02-13 DIAGNOSIS — F419 Anxiety disorder, unspecified: Secondary | ICD-10-CM

## 2019-02-13 MED ORDER — LORAZEPAM 0.5 MG PO TABS: 0.5000 mg | ORAL_TABLET | Freq: Every day | ORAL | 0 refills | Status: DC | PRN

## 2019-02-13 MED ORDER — LORAZEPAM 0.5 MG PO TABS: 0.5000 mg | ORAL_TABLET | Freq: Every day | ORAL | 0 refills | Status: AC | PRN

## 2019-03-12 ENCOUNTER — Encounter
Admission: RE | Admit: 2019-03-12 | Discharge: 2019-03-12 | Disposition: A | Discharge status: Home / Self Care | Payer: Private Health Insurance - Indemnity | Source: Ambulatory Visit | Attending: Internal Medicine | Admitting: Internal Medicine

## 2019-04-12 ENCOUNTER — Encounter
Admission: RE | Admit: 2019-04-12 | Discharge: 2019-04-12 | Disposition: A | Discharge status: Home / Self Care | Payer: Private Health Insurance - Indemnity | Source: Ambulatory Visit | Attending: Internal Medicine | Admitting: Internal Medicine

## 2019-05-13 ENCOUNTER — Encounter
Admission: RE | Admit: 2019-05-13 | Discharge: 2019-05-13 | Disposition: A | Discharge status: Home / Self Care | Payer: Private Health Insurance - Indemnity | Source: Ambulatory Visit | Attending: Internal Medicine | Admitting: Internal Medicine

## 2019-06-13 ENCOUNTER — Encounter
Admission: RE | Admit: 2019-06-13 | Discharge: 2019-06-13 | Disposition: A | Discharge status: Home / Self Care | Payer: Private Health Insurance - Indemnity | Source: Ambulatory Visit | Attending: Internal Medicine | Admitting: Internal Medicine

## 2019-07-21 ENCOUNTER — Encounter
Admission: RE | Admit: 2019-07-21 | Discharge: 2019-07-21 | Disposition: A | Discharge status: Home / Self Care | Payer: Private Health Insurance - Indemnity | Source: Ambulatory Visit | Attending: Internal Medicine | Admitting: Internal Medicine

## 2019-08-26 ENCOUNTER — Other Ambulatory Visit
Admission: RE | Admit: 2019-08-26 | Discharge: 2019-08-26 | Disposition: A | Discharge status: Home / Self Care | Payer: Medicare HMO | Source: Ambulatory Visit | Attending: Internal Medicine | Admitting: Internal Medicine

## 2019-08-26 DIAGNOSIS — R509 Fever, unspecified: Secondary | ICD-10-CM | POA: Diagnosis present

## 2019-08-26 DIAGNOSIS — M2550 Pain in unspecified joint: Secondary | ICD-10-CM | POA: Diagnosis not present

## 2019-08-26 LAB — INFLUENZA PANEL BY PCR (TYPE A & B)
Influenza A By PCR: NEGATIVE
Influenza B By PCR: NEGATIVE

## 2019-08-31 ENCOUNTER — Other Ambulatory Visit (HOSPITAL_COMMUNITY): Payer: Self-pay | Admitting: Critical Care Medicine

## 2019-08-31 DIAGNOSIS — I2510 Atherosclerotic heart disease of native coronary artery without angina pectoris: Secondary | ICD-10-CM

## 2019-08-31 DIAGNOSIS — U071 COVID-19: Secondary | ICD-10-CM

## 2019-08-31 DIAGNOSIS — I10 Essential (primary) hypertension: Secondary | ICD-10-CM

## 2019-08-31 NOTE — Progress Notes (Signed)
  I connected by phone with Sara Stout on 08/31/2019 at 6:45 PM to discuss the potential use of an new treatment for mild to moderate COVID-19 viral infection in non-hospitalized patients.  This patient is a 85 y.o. female that meets the FDA criteria for Emergency Use Authorization of bamlanivimab or casirivimab\imdevimab.  Has a (+) direct SARS-CoV-2 viral test result  Has mild or moderate COVID-19   Is ? 85 years of age and weighs ? 40 kg  Is NOT hospitalized due to COVID-19  Is NOT requiring oxygen therapy or requiring an increase in baseline oxygen flow rate due to COVID-19  Is within 10 days of symptom onset  Has at least one of the high risk factor(s) for progression to severe COVID-19 and/or hospitalization as defined in EUA.  Specific high risk criteria : >/= 83 yo, HTN, CAD   I have spoken and communicated the following to the patient or parent/caregiver:  1. FDA has authorized the emergency use of bamlanivimab and casirivimab\imdevimab for the treatment of mild to moderate COVID-19 in adults and pediatric patients with positive results of direct SARS-CoV-2 viral testing who are 13 years of age and older weighing at least 40 kg, and who are at high risk for progressing to severe COVID-19 and/or hospitalization.  2. The significant known and potential risks and benefits of bamlanivimab and casirivimab\imdevimab, and the extent to which such potential risks and benefits are unknown.  3. Information on available alternative treatments and the risks and benefits of those alternatives, including clinical trials.  4. Patients treated with bamlanivimab and casirivimab\imdevimab should continue to self-isolate and use infection control measures (e.g., wear mask, isolate, social distance, avoid sharing personal items, clean and disinfect "high touch" surfaces, and frequent handwashing) according to CDC guidelines.   5. The patient or parent/caregiver has the option to accept or  refuse bamlanivimab or casirivimab\imdevimab .  After reviewing this information with the patient, The patient agreed to proceed with receiving the bamlanimivab infusion and will be provided a copy of the Fact sheet prior to receiving the infusion.Sara Stout 08/31/2019 6:45 PM

## 2019-09-02 ENCOUNTER — Ambulatory Visit
Admission: RE | Admit: 2019-09-02 | Discharge: 2019-09-02 | Disposition: A | Discharge status: Home / Self Care | Payer: Medicare HMO | Source: Ambulatory Visit | Attending: Critical Care Medicine | Admitting: Critical Care Medicine

## 2019-09-02 ENCOUNTER — Ambulatory Visit (HOSPITAL_COMMUNITY): Payer: Medicare HMO

## 2019-09-02 ENCOUNTER — Ambulatory Visit: Admission: RE | Admit: 2019-09-02 | Payer: Medicare HMO | Source: Ambulatory Visit

## 2019-10-30 ENCOUNTER — Encounter
Admission: RE | Admit: 2019-10-30 | Discharge: 2019-10-30 | Disposition: A | Discharge status: Home / Self Care | Payer: Medicare HMO | Source: Ambulatory Visit | Attending: Internal Medicine | Admitting: Internal Medicine

## 2020-01-12 ENCOUNTER — Encounter
Admission: RE | Admit: 2020-01-12 | Discharge: 2020-01-12 | Disposition: A | Discharge status: Home / Self Care | Payer: Medicare HMO | Source: Ambulatory Visit | Attending: Internal Medicine | Admitting: Internal Medicine

## 2020-03-29 ENCOUNTER — Encounter
Admission: RE | Admit: 2020-03-29 | Discharge: 2020-03-29 | Disposition: A | Discharge status: Home / Self Care | Payer: Medicare HMO | Source: Ambulatory Visit | Attending: Internal Medicine | Admitting: Internal Medicine

## 2020-07-22 ENCOUNTER — Encounter
Admission: RE | Admit: 2020-07-22 | Discharge: 2020-07-22 | Disposition: A | Discharge status: Home / Self Care | Payer: Medicare HMO | Source: Ambulatory Visit | Attending: Internal Medicine | Admitting: Internal Medicine

## 2020-11-25 ENCOUNTER — Other Ambulatory Visit
Admission: RE | Admit: 2020-11-25 | Discharge: 2020-11-25 | Disposition: A | Discharge status: Home / Self Care | Payer: Medicare HMO | Source: Skilled Nursing Facility | Attending: Internal Medicine | Admitting: Internal Medicine

## 2020-11-25 DIAGNOSIS — F22 Delusional disorders: Secondary | ICD-10-CM | POA: Diagnosis present

## 2020-11-25 DIAGNOSIS — R630 Anorexia: Secondary | ICD-10-CM | POA: Diagnosis not present

## 2020-11-25 LAB — URINALYSIS, COMPLETE (UACMP) WITH MICROSCOPIC
Glucose, UA: NEGATIVE mg/dL
Nitrite: POSITIVE — AB
Protein, ur: 100 mg/dL — AB
Specific Gravity, Urine: >1.03 — ABNORMAL HIGH (ref 1.005–1.030)
pH: 5.5 (ref 5.0–8.0)

## 2020-11-27 LAB — URINE CULTURE: Culture: >=100000 — AB

## 2020-12-15 ENCOUNTER — Non-Acute Institutional Stay: Payer: Medicare HMO | Admitting: Adult Health Nurse Practitioner

## 2020-12-15 ENCOUNTER — Other Ambulatory Visit: Payer: Self-pay

## 2020-12-15 DIAGNOSIS — R5381 Other malaise: Secondary | ICD-10-CM

## 2020-12-15 DIAGNOSIS — G301 Alzheimer's disease with late onset: Secondary | ICD-10-CM

## 2020-12-15 DIAGNOSIS — F0281 Dementia in other diseases classified elsewhere with behavioral disturbance: Secondary | ICD-10-CM

## 2020-12-15 DIAGNOSIS — Z515 Encounter for palliative care: Secondary | ICD-10-CM

## 2020-12-15 NOTE — Progress Notes (Signed)
Therapist, nutritional Palliative Care Consult Note Telephone: [phone]  Fax: [phone]  PATIENT NAME: Sara Stout DOB: 24-Jun-1931 MRN: [id number]  PRIMARY CARE PROVIDER:   [name], MD  REFERRING PROVIDER:  [name], MD [address] [facility] [address] [place],  [place] [address]  RESPONSIBLE PARTY:   Jermesha Sottile, husband H: [phone]  C: [phone]  Chief complaint: initial palliative visit/debility  Spoke with husband via phone    RECOMMENDATIONS and PLAN:  1.  Advanced care planning.  Patient is full code.  Spoke with husband uncertain about whether he would like her to be DNR or not.  Agreed to have Hard Choices book mailed to him.  Stated he would like to talk about ACP with his family too.  Sent Hard Choices book to him.  2.  Dementia/debility.  Patient is having decline.  Have talked with husband that if she continues to decline that she may need to transition to skilled nursing.  Husband not ready to talk about hospice at this time.  We will continue to monitor for decline and discussed this with the husband.  Continue supportive care at facility  Palliative will continue to monitor for symptom management/decline and make recommendations as needed.  Follow-up in 4 to 6 weeks.  Has been encouraged to call with any questions or concerns.   I spent 45 minutes providing this consultation, including time spent with patient/family, provider coordination, chart review including most recent labs and imaging, documentation. More than 50% of the time in this consultation was spent coordinating communication.   HISTORY OF PRESENT ILLNESS:  Sara Stout is a 87 y.o. year old female with multiple medical problems including Alzheimer's dementia, fibromyalgia, CAD, HLD, depression, anxiety, IBS, h/o of MI. Palliative Care was asked to help address goals of care.  Patient is resident of assisted living at the [facility] at  [place].  Patient has had decline functionally.  Patient unable to provide HPI/ROS secondary to dementia.  Per staff patient used to offer more assistance with ADLs and with walking.  Staff does report that especially in the mornings sometimes requires 2 person assist to get out of bed and for a.m. care.  She is having more incontinent episodes.  Staff does report that she is not talking as much and sometimes is harder to redirect.  She is still able to stand at times and walk with walker but does have occasions where she does require assistance with standing.  Patient does not speak much with provider today.  She is following commands but sometimes has to be shown what is expected.  Patient has just finished antibiotic for UTI.  Staff does report that she has redness on her bottom but no wounds.  Patient did have a fall in the early part of January 2022 with no acute injury.  Staff reports that she is eating less than 25% of her meals patient has had 15 pound weight loss over the past 5 months.  On 07/14/2020 she weighed 170.5 pounds with BMI of 30.2.  On 12/10/2020 she weighed 155.6 pounds with BMI of 27.56.  CODE STATUS: see above  PPS: 40-30% HOSPICE ELIGIBILITY/DIAGNOSIS: TBD  PHYSICAL EXAM:  HR  62  O2 94% on RA General: NAD, frail appearing Eyes: sclera anicteric and noninjected with no discharge noted Cardiovascular: regular rate and rhythm Pulmonary: lung sounds clear; normal respiratory effort Abdomen: soft, nontender, + bowel sounds Extremities: no edema, no joint deformities Skin: no  rashes on exposed skin Neurological: Weakness; patient A&O to person and place  Family History  Problem Relation Age of Onset  . Heart disease Father  MI in 61s  . Alzheimer's disease Father  . Myocardial Infarction (Heart attack) Mother  but also had colon cancer  . Cancer Mother  . Heart disease Sister  MI in her 5s  . Cancer Sister      PAST MEDICAL HISTORY:  Past Medical History:   Diagnosis Date  . Allergy   . Anxiety   . Arthritis   . Asthma   . Baker's cyst    R knee  . Chest pain, unspecified 07/28/2013  . Coronary artery disease 07/28/2013  . Coronary heart disease   . Dementia (HCC)   . Diverticulosis   . Encounter for Medicare annual wellness exam 09/29/2013  . Fibromyalgia   . Hematuria   . History of echocardiogram    EF > 55%, inf wall motion abnormalities, post MI  . Hyperlipidemia, unspecified    LDL 105 - Atorvostatin initiated 8/13, target LDL < 70  . Hypertension   . IBS (irritable bowel syndrome)   . Kidney stones   . Living will, counseling/discussion   . Mild dementia (HCC)   . Myocardial infarct (HCC)    ST elevation MI  . Nephrolithiasis    [name]  . Osteoporosis   . Stented coronary artery    RCA 05/10/12, x2 DES mid and prox RCA  . UTI (lower urinary tract infection)     SOCIAL HX:  Social History   Tobacco Use  . Smoking status: Never Smoker  . Smokeless tobacco: Never Used  Substance Use Topics  . Alcohol use: No    ALLERGIES:  Allergies  Allergen Reactions  . Epinephrine Other (See Comments)    Rapid heart beat.  . Dtap-Hepatitis B Recomb-Ipv Other (See Comments)  . Levofloxacin Swelling    Edema in knee Edema in knee  . Metronidazole Nausea Only    Nausea and edema in knee Nausea and edema in knee  . Other     [place] - Other reaction(s): Unknown Sympathomimetic agents   . Penicillins   . Tetanus Toxoids     Arm swelling  . Amoxicillin-Pot Clavulanate Rash     PERTINENT MEDICATIONS:  Outpatient Encounter Medications as of 12/15/2020  Medication Sig  . acetaminophen (TYLENOL) 325 MG tablet Take 650 mg by mouth 2 (two) times daily.  Marland Kitchen aspirin 81 MG chewable tablet Chew 81 mg by mouth daily. With meal  . atorvastatin (LIPITOR) 20 MG tablet Take 20 mg by mouth daily. per Alwyn Pea, MD  . Cholecalciferol (VITAMIN D3) 2000 units capsule Take 2,000 Units by mouth daily.  . cyanocobalamin  1000 MCG tablet Take 1,000 mcg by mouth daily.  . diclofenac sodium (VOLTAREN) 1 % GEL Apply 4 g topically 4 (four) times daily.   . divalproex (DEPAKOTE SPRINKLE) 125 MG capsule Take 375 mg by mouth 2 (two) times daily. 3 capsules to = 375 mg  . escitalopram (LEXAPRO) 10 MG tablet Take 10 mg by mouth daily.   . furosemide (LASIX) 20 MG tablet Take 20 mg by mouth daily as needed for edema. per Dr. Juliann Pares  . galantamine (RAZADYNE) 8 MG tablet Take 8 mg by mouth 2 (two) times daily. give at 8:30 AM and 5:30 PM  . memantine (NAMENDA XR) 14 MG CP24 24 hr capsule Take 14 mg by mouth daily. Take with meals  . nitroGLYCERIN (NITROSTAT)  0.4 MG SL tablet Place 1 tablet under the tongue every 5 (five) minutes as needed for chest pain.   . NON FORMULARY Diet -X Liquids , Diet: _X_ regular  . olmesartan (BENICAR) 20 MG tablet Take 20 mg by mouth daily.   No facility-administered encounter medications on file as of 12/15/2020.      Ricarda Atayde Marlena Clipper, NP

## 2020-12-28 ENCOUNTER — Other Ambulatory Visit
Admission: RE | Admit: 2020-12-28 | Discharge: 2020-12-28 | Disposition: A | Discharge status: Home / Self Care | Payer: Medicare HMO | Source: Ambulatory Visit | Attending: Internal Medicine | Admitting: Internal Medicine

## 2020-12-28 DIAGNOSIS — F32A Depression, unspecified: Secondary | ICD-10-CM | POA: Diagnosis not present

## 2020-12-28 DIAGNOSIS — F419 Anxiety disorder, unspecified: Secondary | ICD-10-CM | POA: Insufficient documentation

## 2020-12-28 DIAGNOSIS — Z79899 Other long term (current) drug therapy: Secondary | ICD-10-CM | POA: Insufficient documentation

## 2020-12-28 DIAGNOSIS — F0390 Unspecified dementia without behavioral disturbance: Secondary | ICD-10-CM | POA: Insufficient documentation

## 2020-12-28 DIAGNOSIS — F39 Unspecified mood [affective] disorder: Secondary | ICD-10-CM | POA: Insufficient documentation

## 2020-12-28 LAB — COMPREHENSIVE METABOLIC PANEL
ALT: 14 U/L (ref 0–44)
AST: 25 U/L (ref 15–41)
Albumin: 3.7 g/dL (ref 3.5–5.0)
Alkaline Phosphatase: 56 U/L (ref 38–126)
Anion gap: 8 (ref 5–15)
BUN: 20 mg/dL (ref 8–23)
CO2: 30 mmol/L (ref 22–32)
Calcium: 8.9 mg/dL (ref 8.9–10.3)
Chloride: 104 mmol/L (ref 98–111)
Creatinine, Ser: 0.65 mg/dL (ref 0.44–1.00)
GFR, Estimated: >60 mL/min (ref >60)
Glucose, Bld: 108 mg/dL — ABNORMAL HIGH (ref 70–99)
Potassium: 3.9 mmol/L (ref 3.5–5.1)
Sodium: 142 mmol/L (ref 135–145)
Total Bilirubin: 0.6 mg/dL (ref 0.3–1.2)
Total Protein: 7 g/dL (ref 6.5–8.1)

## 2020-12-28 LAB — CBC WITH DIFFERENTIAL/PLATELET
Abs Immature Granulocytes: 0.01 10*3/uL (ref 0.00–0.07)
Basophils Absolute: 0 10*3/uL (ref 0.0–0.1)
Basophils Relative: 1 %
Eosinophils Absolute: 0.1 10*3/uL (ref 0.0–0.5)
Eosinophils Relative: 2 %
HCT: 43.1 % (ref 36.0–46.0)
Hemoglobin: 13.9 g/dL (ref 12.0–15.0)
Immature Granulocytes: 0 %
Lymphocytes Relative: 35 %
Lymphs Abs: 1.8 10*3/uL (ref 0.7–4.0)
MCH: 30.2 pg (ref 26.0–34.0)
MCHC: 32.3 g/dL (ref 30.0–36.0)
MCV: 93.7 fL (ref 80.0–100.0)
Monocytes Absolute: 0.4 10*3/uL (ref 0.1–1.0)
Monocytes Relative: 7 %
Neutro Abs: 2.9 10*3/uL (ref 1.7–7.7)
Neutrophils Relative %: 55 %
Platelets: 133 10*3/uL — ABNORMAL LOW (ref 150–400)
RBC: 4.6 MIL/uL (ref 3.87–5.11)
RDW: 13.4 % (ref 11.5–15.5)
WBC: 5.2 10*3/uL (ref 4.0–10.5)
nRBC: 0 % (ref 0.0–0.2)

## 2020-12-28 LAB — AMMONIA: Ammonia: 29 umol/L (ref 9–35)

## 2020-12-28 LAB — VALPROIC ACID LEVEL: Valproic Acid Lvl: 71 ug/mL (ref 50.0–100.0)

## 2020-12-28 LAB — URIC ACID: Uric Acid, Serum: 4.9 mg/dL (ref 2.5–7.1)

## 2021-02-18 ENCOUNTER — Encounter: Payer: Self-pay | Admitting: Adult Health Nurse Practitioner

## 2021-02-18 ENCOUNTER — Non-Acute Institutional Stay: Payer: Medicare HMO | Admitting: Adult Health Nurse Practitioner

## 2021-02-18 ENCOUNTER — Other Ambulatory Visit: Payer: Self-pay

## 2021-02-18 ENCOUNTER — Other Ambulatory Visit
Admission: RE | Admit: 2021-02-18 | Discharge: 2021-02-18 | Disposition: A | Discharge status: Home / Self Care | Payer: Medicare HMO | Source: Ambulatory Visit | Attending: Internal Medicine | Admitting: Internal Medicine

## 2021-02-18 VITALS — HR 67 | Wt 146.0 lb

## 2021-02-18 DIAGNOSIS — Z79899 Other long term (current) drug therapy: Secondary | ICD-10-CM | POA: Insufficient documentation

## 2021-02-18 DIAGNOSIS — R1310 Dysphagia, unspecified: Secondary | ICD-10-CM

## 2021-02-18 DIAGNOSIS — E46 Unspecified protein-calorie malnutrition: Secondary | ICD-10-CM

## 2021-02-18 DIAGNOSIS — F0390 Unspecified dementia without behavioral disturbance: Secondary | ICD-10-CM | POA: Insufficient documentation

## 2021-02-18 DIAGNOSIS — G301 Alzheimer's disease with late onset: Secondary | ICD-10-CM

## 2021-02-18 DIAGNOSIS — F39 Unspecified mood [affective] disorder: Secondary | ICD-10-CM | POA: Diagnosis not present

## 2021-02-18 DIAGNOSIS — F0281 Dementia in other diseases classified elsewhere with behavioral disturbance: Secondary | ICD-10-CM

## 2021-02-18 DIAGNOSIS — F32A Depression, unspecified: Secondary | ICD-10-CM | POA: Insufficient documentation

## 2021-02-18 DIAGNOSIS — Z515 Encounter for palliative care: Secondary | ICD-10-CM

## 2021-02-18 DIAGNOSIS — E785 Hyperlipidemia, unspecified: Secondary | ICD-10-CM | POA: Insufficient documentation

## 2021-02-18 LAB — COMPREHENSIVE METABOLIC PANEL
ALT: 11 U/L (ref 0–44)
AST: 21 U/L (ref 15–41)
Albumin: 3.1 g/dL — ABNORMAL LOW (ref 3.5–5.0)
Alkaline Phosphatase: 43 U/L (ref 38–126)
Anion gap: 10 (ref 5–15)
BUN: 16 mg/dL (ref 8–23)
CO2: 28 mmol/L (ref 22–32)
Calcium: 8.8 mg/dL — ABNORMAL LOW (ref 8.9–10.3)
Chloride: 100 mmol/L (ref 98–111)
Creatinine, Ser: 0.55 mg/dL (ref 0.44–1.00)
GFR, Estimated: >60 mL/min (ref >60)
Glucose, Bld: 84 mg/dL (ref 70–99)
Potassium: 3.7 mmol/L (ref 3.5–5.1)
Sodium: 138 mmol/L (ref 135–145)
Total Bilirubin: 0.6 mg/dL (ref 0.3–1.2)
Total Protein: 6.2 g/dL — ABNORMAL LOW (ref 6.5–8.1)

## 2021-02-18 LAB — CBC WITH DIFFERENTIAL/PLATELET
Abs Immature Granulocytes: 0.02 10*3/uL (ref 0.00–0.07)
Basophils Absolute: 0 10*3/uL (ref 0.0–0.1)
Basophils Relative: 1 %
Eosinophils Absolute: 0.1 10*3/uL (ref 0.0–0.5)
Eosinophils Relative: 2 %
HCT: 40.5 % (ref 36.0–46.0)
Hemoglobin: 13.4 g/dL (ref 12.0–15.0)
Immature Granulocytes: 0 %
Lymphocytes Relative: 38 %
Lymphs Abs: 2.2 10*3/uL (ref 0.7–4.0)
MCH: 30.9 pg (ref 26.0–34.0)
MCHC: 33.1 g/dL (ref 30.0–36.0)
MCV: 93.3 fL (ref 80.0–100.0)
Monocytes Absolute: 0.6 10*3/uL (ref 0.1–1.0)
Monocytes Relative: 11 %
Neutro Abs: 2.8 10*3/uL (ref 1.7–7.7)
Neutrophils Relative %: 48 %
Platelets: 149 10*3/uL — ABNORMAL LOW (ref 150–400)
RBC: 4.34 MIL/uL (ref 3.87–5.11)
RDW: 13.1 % (ref 11.5–15.5)
WBC: 5.9 10*3/uL (ref 4.0–10.5)
nRBC: 0 % (ref 0.0–0.2)

## 2021-02-18 LAB — AMMONIA: Ammonia: 31 umol/L (ref 9–35)

## 2021-02-18 LAB — VALPROIC ACID LEVEL: Valproic Acid Lvl: 61 ug/mL (ref 50.0–100.0)

## 2021-02-18 NOTE — Progress Notes (Signed)
Designer, [facility]ell[facility] Palliative Care Consult Note Telephone: [phone]  Fax: [phone]    Date of encounter: 02/18/21 PATIENT NAME: Sara Stout [address] [place] [address]   [phone] (home)  DOB: [birth year] MRN: [id number] PRIMARY CARE PROVIDER:    Kirk Ruths, MD,  [place] [address] [phone]  REFERRING PROVIDER:   Kirk Ruths, MD [facility] [place],  [place] [address] [phone]  RESPONSIBLE PARTY:    Contact Information     Name Relation Home Work Mobile   [place] Spouse [phone]  [phone]   Ailene Ravel Niece [phone]        I met face to face with patient in facility. Palliative Care was asked to follow this patient by consultation request of  Kirk Ruths, MD to address advance care planning and complex medical decision making. This is a follow up visit.  Called husband to update on today's visit.  Left VM with reason for call and call back info                                   ASSESSMENT AND PLAN / RECOMMENDATIONS:   Advance Care Planning/Goals of Care: Goals include to maximize quality of life and symptom management.  CODE STATUS: DNR  Symptom Management/Plan:  Dementia: Patient having functional decline, see details below.  Also having continued cognitive decline and mental status changes as today she is lethargic though this may be exasperated by her pneumonia infection.  PCM: Patient having weight loss, see details below.  Has been on supplementation and more consistently is eating 25/% or less of her meals.    Dysphagia:  She continues to aspirate despite thickened liquids and pured food currently being treated for aspiration pneumonia.  This will more than likely be ongoing and will have multiple episodes of aspiration/pneumonia  Believe patient would  benefit with hospice services at this time.  Have reached out to hospice physicians who agree that she meets eligibility requirements for hospice.  Have left voice message with husband to call me back to discuss today's visit with him and recommendations.  We will proceed with hospice referral if husband is in agreement.  Follow up Palliative Care Visit: Palliative care will continue to follow for complex medical decision making, advance care planning, and clarification of goals. Will have discussion with husband about hospice  I spent 35 minutes providing this consultation. More than 50% of the time in this consultation was spent in counseling and care coordination.  PPS: 30%  HOSPICE ELIGIBILITY/DIAGNOSIS: TBD  Chief Complaint: follow up palliative visit  HISTORY OF PRESENT ILLNESS:  Sara Stout is a 87 y.o. year old female  with Alzheimer's dementia, fibromyalgia, CAD, HLD, depression, anxiety, IBS, h/o of MI. 2 months ago patient was in AL and was able to assist some with transfers and ADLs.  Was PPS 40%.  She is now pretty much bedbound requiring total care including feeding.  PPS 30%.  She is incontinent of B&B. In the past 2 months she has had a 6.2% weight loss. On 12/10/20 she weighed 155.6 pounds with BMI of 27.56.  On 02/14/21 she weighed 146 pounds with BMI of 25.89.  No reported wounds.  Most recently has started having increased dysphagia and is currently being treated for aspiration pneumonia and yesterday  had to start her on supplemental O2 at 1.5L continuous.  Staff reports her coughing and aspirating even on nectar thick liquids and puree food. Today she was lethargic.  She was arousable but not verbalizing.   History obtained from review of EMR and interview with facility staff and Ms. Tabb.    Physical Exam:  Constitutional: NAD General: frail appearing EYES: anicteric sclera, lids intact, no discharge  ENMT: intact hearing, oral mucous membranes moist CV: S1S2, RRR, no LE  edema Pulmonary: lung sounds diminished and unable to hear much as she is having shallow breaths, no increased work of breathing, no cough Abdomen: normo-active BS + 4 quadrants, soft and non tender MSK: bed bound Skin: warm and dry, no rashes on visible skin Neuro:  patient lethargic but easily arousable.  Is not verbalizing today Hem/lymph/immuno: no widespread bruising   Thank you for the opportunity to participate in the care of Ms. Prindle.  The palliative care team will continue to follow. Please call our office at [phone] if we can be of additional assistance.   Brenae Lasecki Jenetta Downer, NP , DNP  This chart was dictated using voice recognition software.  Despite best efforts to proofread,  errors can occur which can change the documentation meaning.   COVID-19 PATIENT SCREENING TOOL Asked and negative response unless otherwise noted:   Have you had symptoms of covid, tested positive or been in contact with someone with symptoms/positive test in the past 5-10 days?

## 2021-05-12 DEATH — deceased
